# Patient Record
Sex: Female | Born: 1935 | Race: White | Hispanic: No
Health system: Southern US, Community
[De-identification: ages and names within clinical notes are randomized; demographics above are authoritative.]

---

## 2000-01-06 ENCOUNTER — Encounter: Payer: Self-pay | Admitting: Neurosurgery

## 2000-01-06 ENCOUNTER — Ambulatory Visit (HOSPITAL_COMMUNITY): Admission: RE | Admit: 2000-01-06 | Discharge: 2000-01-06 | Payer: Self-pay | Admitting: Neurosurgery

## 2004-07-01 ENCOUNTER — Ambulatory Visit: Payer: Self-pay | Admitting: Podiatry

## 2004-07-23 ENCOUNTER — Inpatient Hospital Stay: Payer: Self-pay | Admitting: Internal Medicine

## 2004-08-14 ENCOUNTER — Emergency Department: Payer: Self-pay | Admitting: Emergency Medicine

## 2004-12-13 ENCOUNTER — Ambulatory Visit: Payer: Self-pay | Admitting: Orthopedic Surgery

## 2005-02-26 ENCOUNTER — Other Ambulatory Visit: Payer: Self-pay

## 2005-02-26 ENCOUNTER — Inpatient Hospital Stay: Payer: Self-pay | Admitting: Internal Medicine

## 2005-03-07 ENCOUNTER — Other Ambulatory Visit: Payer: Self-pay

## 2005-03-07 ENCOUNTER — Inpatient Hospital Stay: Payer: Self-pay | Admitting: Internal Medicine

## 2005-04-03 ENCOUNTER — Ambulatory Visit: Payer: Self-pay | Admitting: Anesthesiology

## 2005-04-20 ENCOUNTER — Ambulatory Visit: Payer: Self-pay | Admitting: Anesthesiology

## 2005-05-10 ENCOUNTER — Ambulatory Visit: Payer: Self-pay | Admitting: Internal Medicine

## 2005-06-27 ENCOUNTER — Ambulatory Visit: Payer: Self-pay | Admitting: Anesthesiology

## 2005-08-15 ENCOUNTER — Ambulatory Visit: Payer: Self-pay | Admitting: Anesthesiology

## 2005-09-10 ENCOUNTER — Inpatient Hospital Stay: Payer: Self-pay | Admitting: Internal Medicine

## 2005-10-26 ENCOUNTER — Other Ambulatory Visit: Payer: Self-pay

## 2005-10-26 ENCOUNTER — Inpatient Hospital Stay: Payer: Self-pay | Admitting: Internal Medicine

## 2005-11-22 ENCOUNTER — Encounter: Payer: Self-pay | Admitting: Internal Medicine

## 2005-12-03 ENCOUNTER — Other Ambulatory Visit: Payer: Self-pay

## 2005-12-03 ENCOUNTER — Emergency Department: Payer: Self-pay | Admitting: Emergency Medicine

## 2005-12-07 ENCOUNTER — Ambulatory Visit: Payer: Self-pay | Admitting: Anesthesiology

## 2005-12-22 ENCOUNTER — Encounter: Payer: Self-pay | Admitting: Internal Medicine

## 2006-02-01 ENCOUNTER — Ambulatory Visit: Payer: Self-pay | Admitting: Anesthesiology

## 2006-03-02 ENCOUNTER — Encounter: Payer: Self-pay | Admitting: Orthopedic Surgery

## 2006-03-24 ENCOUNTER — Encounter: Payer: Self-pay | Admitting: Orthopedic Surgery

## 2006-03-31 ENCOUNTER — Other Ambulatory Visit: Payer: Self-pay

## 2006-03-31 ENCOUNTER — Emergency Department: Payer: Self-pay | Admitting: Emergency Medicine

## 2006-04-10 ENCOUNTER — Ambulatory Visit: Payer: Self-pay | Admitting: Internal Medicine

## 2006-04-13 ENCOUNTER — Ambulatory Visit: Payer: Self-pay | Admitting: Internal Medicine

## 2006-04-23 ENCOUNTER — Encounter: Payer: Self-pay | Admitting: Orthopedic Surgery

## 2006-04-30 ENCOUNTER — Ambulatory Visit: Payer: Self-pay | Admitting: Anesthesiology

## 2006-05-31 ENCOUNTER — Ambulatory Visit: Payer: Self-pay | Admitting: Surgery

## 2006-05-31 ENCOUNTER — Other Ambulatory Visit: Payer: Self-pay

## 2006-06-04 ENCOUNTER — Ambulatory Visit: Payer: Self-pay | Admitting: Surgery

## 2006-06-18 ENCOUNTER — Ambulatory Visit: Payer: Self-pay | Admitting: Anesthesiology

## 2006-07-02 ENCOUNTER — Ambulatory Visit: Payer: Self-pay | Admitting: Oncology

## 2006-07-25 ENCOUNTER — Ambulatory Visit: Payer: Self-pay | Admitting: Surgery

## 2006-07-30 ENCOUNTER — Ambulatory Visit: Payer: Self-pay | Admitting: Radiation Oncology

## 2006-08-02 ENCOUNTER — Ambulatory Visit: Payer: Self-pay | Admitting: Anesthesiology

## 2006-08-24 ENCOUNTER — Ambulatory Visit: Payer: Self-pay | Admitting: Radiation Oncology

## 2006-09-22 ENCOUNTER — Ambulatory Visit: Payer: Self-pay | Admitting: Radiation Oncology

## 2006-10-03 ENCOUNTER — Ambulatory Visit: Payer: Self-pay | Admitting: Anesthesiology

## 2006-10-23 ENCOUNTER — Ambulatory Visit: Payer: Self-pay | Admitting: Radiation Oncology

## 2006-11-06 ENCOUNTER — Ambulatory Visit: Payer: Self-pay | Admitting: Anesthesiology

## 2006-11-22 ENCOUNTER — Ambulatory Visit: Payer: Self-pay | Admitting: Radiation Oncology

## 2006-12-06 ENCOUNTER — Ambulatory Visit: Payer: Self-pay | Admitting: Anesthesiology

## 2007-01-07 ENCOUNTER — Ambulatory Visit: Payer: Self-pay | Admitting: Anesthesiology

## 2007-02-27 ENCOUNTER — Ambulatory Visit: Payer: Self-pay | Admitting: Anesthesiology

## 2007-03-25 ENCOUNTER — Ambulatory Visit: Payer: Self-pay | Admitting: Oncology

## 2007-04-07 ENCOUNTER — Inpatient Hospital Stay: Payer: Self-pay | Admitting: Internal Medicine

## 2007-04-07 ENCOUNTER — Other Ambulatory Visit: Payer: Self-pay

## 2007-04-18 ENCOUNTER — Ambulatory Visit: Payer: Self-pay | Admitting: Oncology

## 2007-04-22 ENCOUNTER — Ambulatory Visit: Payer: Self-pay | Admitting: Podiatry

## 2007-04-24 ENCOUNTER — Ambulatory Visit: Payer: Self-pay | Admitting: Oncology

## 2007-04-27 ENCOUNTER — Inpatient Hospital Stay: Payer: Self-pay | Admitting: Podiatry

## 2007-05-02 ENCOUNTER — Encounter: Payer: Self-pay | Admitting: Internal Medicine

## 2007-05-30 ENCOUNTER — Ambulatory Visit: Payer: Self-pay | Admitting: Internal Medicine

## 2007-06-05 ENCOUNTER — Emergency Department: Payer: Self-pay | Admitting: Unknown Physician Specialty

## 2007-06-13 ENCOUNTER — Encounter: Payer: Self-pay | Admitting: Internal Medicine

## 2007-06-24 ENCOUNTER — Encounter: Payer: Self-pay | Admitting: Internal Medicine

## 2007-06-27 ENCOUNTER — Ambulatory Visit: Payer: Self-pay | Admitting: Anesthesiology

## 2007-08-30 ENCOUNTER — Ambulatory Visit: Payer: Self-pay | Admitting: Anesthesiology

## 2007-10-14 ENCOUNTER — Ambulatory Visit: Payer: Self-pay | Admitting: Oncology

## 2007-10-15 ENCOUNTER — Ambulatory Visit: Payer: Self-pay | Admitting: Anesthesiology

## 2007-12-17 ENCOUNTER — Ambulatory Visit: Payer: Self-pay | Admitting: Anesthesiology

## 2008-01-28 ENCOUNTER — Ambulatory Visit: Payer: Self-pay | Admitting: Oncology

## 2008-02-22 ENCOUNTER — Ambulatory Visit: Payer: Self-pay | Admitting: Oncology

## 2008-03-09 ENCOUNTER — Ambulatory Visit: Payer: Self-pay

## 2008-03-18 ENCOUNTER — Ambulatory Visit: Payer: Self-pay | Admitting: Oncology

## 2008-03-24 ENCOUNTER — Ambulatory Visit: Payer: Self-pay | Admitting: Oncology

## 2008-04-03 ENCOUNTER — Ambulatory Visit: Payer: Self-pay | Admitting: Anesthesiology

## 2008-04-08 ENCOUNTER — Ambulatory Visit: Payer: Self-pay | Admitting: Unknown Physician Specialty

## 2008-06-02 ENCOUNTER — Ambulatory Visit: Payer: Self-pay | Admitting: Anesthesiology

## 2008-11-05 ENCOUNTER — Ambulatory Visit: Payer: Self-pay | Admitting: Anesthesiology

## 2008-12-22 ENCOUNTER — Ambulatory Visit: Payer: Self-pay | Admitting: Oncology

## 2008-12-24 ENCOUNTER — Ambulatory Visit: Payer: Self-pay | Admitting: Oncology

## 2008-12-25 ENCOUNTER — Ambulatory Visit: Payer: Self-pay | Admitting: Oncology

## 2009-01-04 ENCOUNTER — Ambulatory Visit: Payer: Self-pay | Admitting: Anesthesiology

## 2009-01-21 ENCOUNTER — Ambulatory Visit: Payer: Self-pay | Admitting: Oncology

## 2009-03-21 ENCOUNTER — Ambulatory Visit: Payer: Self-pay | Admitting: Internal Medicine

## 2009-04-26 ENCOUNTER — Ambulatory Visit: Payer: Self-pay | Admitting: Anesthesiology

## 2009-06-23 ENCOUNTER — Ambulatory Visit: Payer: Self-pay | Admitting: Anesthesiology

## 2009-07-09 ENCOUNTER — Emergency Department: Payer: Self-pay | Admitting: Emergency Medicine

## 2009-07-24 ENCOUNTER — Ambulatory Visit: Payer: Self-pay | Admitting: Oncology

## 2009-08-16 ENCOUNTER — Ambulatory Visit: Payer: Self-pay | Admitting: Oncology

## 2009-08-24 ENCOUNTER — Ambulatory Visit: Payer: Self-pay | Admitting: Oncology

## 2009-09-10 ENCOUNTER — Ambulatory Visit: Payer: Self-pay | Admitting: Anesthesiology

## 2009-10-28 ENCOUNTER — Ambulatory Visit: Payer: Self-pay | Admitting: Unknown Physician Specialty

## 2009-11-30 ENCOUNTER — Ambulatory Visit: Payer: Self-pay | Admitting: Anesthesiology

## 2009-12-03 ENCOUNTER — Emergency Department: Payer: Self-pay | Admitting: Emergency Medicine

## 2009-12-22 ENCOUNTER — Ambulatory Visit: Payer: Self-pay | Admitting: Oncology

## 2009-12-27 ENCOUNTER — Ambulatory Visit: Payer: Self-pay | Admitting: Oncology

## 2010-01-13 ENCOUNTER — Ambulatory Visit: Payer: Self-pay | Admitting: Oncology

## 2010-01-14 ENCOUNTER — Ambulatory Visit: Payer: Self-pay | Admitting: Internal Medicine

## 2010-01-21 ENCOUNTER — Ambulatory Visit: Payer: Self-pay | Admitting: Oncology

## 2010-01-28 ENCOUNTER — Ambulatory Visit: Payer: Self-pay | Admitting: Anesthesiology

## 2010-05-13 ENCOUNTER — Ambulatory Visit: Payer: Self-pay | Admitting: Anesthesiology

## 2010-07-25 ENCOUNTER — Emergency Department: Payer: Self-pay | Admitting: Emergency Medicine

## 2010-07-30 ENCOUNTER — Emergency Department: Payer: Self-pay | Admitting: Emergency Medicine

## 2010-08-29 ENCOUNTER — Ambulatory Visit: Payer: Self-pay | Admitting: Anesthesiology

## 2010-11-30 ENCOUNTER — Ambulatory Visit: Payer: Self-pay | Admitting: Anesthesiology

## 2010-12-29 ENCOUNTER — Ambulatory Visit: Payer: Self-pay | Admitting: Podiatry

## 2011-01-20 ENCOUNTER — Ambulatory Visit: Payer: Self-pay | Admitting: Oncology

## 2011-02-15 ENCOUNTER — Ambulatory Visit: Payer: Self-pay | Admitting: Oncology

## 2011-02-28 ENCOUNTER — Ambulatory Visit: Payer: Self-pay | Admitting: Anesthesiology

## 2011-03-09 ENCOUNTER — Ambulatory Visit: Payer: Self-pay | Admitting: Oncology

## 2011-03-25 ENCOUNTER — Ambulatory Visit: Payer: Self-pay | Admitting: Oncology

## 2011-05-17 ENCOUNTER — Ambulatory Visit: Payer: Self-pay | Admitting: Anesthesiology

## 2011-07-01 ENCOUNTER — Emergency Department: Payer: Self-pay | Admitting: *Deleted

## 2011-09-06 ENCOUNTER — Ambulatory Visit: Payer: Self-pay | Admitting: Anesthesiology

## 2011-11-21 ENCOUNTER — Emergency Department: Payer: Self-pay | Admitting: Emergency Medicine

## 2011-11-21 LAB — CBC
HCT: 39.4 % (ref 35.0–47.0)
HGB: 13 g/dL (ref 12.0–16.0)
MCH: 29.9 pg (ref 26.0–34.0)
MCHC: 32.9 g/dL (ref 32.0–36.0)
MCV: 91 fL (ref 80–100)
Platelet: 260 10*3/uL (ref 150–440)
RBC: 4.34 10*6/uL (ref 3.80–5.20)
RDW: 14.8 % — ABNORMAL HIGH (ref 11.5–14.5)
WBC: 9.6 10*3/uL (ref 3.6–11.0)

## 2011-11-21 LAB — COMPREHENSIVE METABOLIC PANEL
Albumin: 3.2 g/dL — ABNORMAL LOW (ref 3.4–5.0)
Alkaline Phosphatase: 77 U/L (ref 50–136)
Anion Gap: 6 — ABNORMAL LOW (ref 7–16)
BUN: 21 mg/dL — ABNORMAL HIGH (ref 7–18)
Bilirubin,Total: 0.4 mg/dL (ref 0.2–1.0)
Calcium, Total: 9.1 mg/dL (ref 8.5–10.1)
Chloride: 106 mmol/L (ref 98–107)
Co2: 28 mmol/L (ref 21–32)
Creatinine: 0.57 mg/dL — ABNORMAL LOW (ref 0.60–1.30)
EGFR (African American): >60
EGFR (Non-African Amer.): >60
Glucose: 97 mg/dL (ref 65–99)
Osmolality: 282 (ref 275–301)
Potassium: 4.1 mmol/L (ref 3.5–5.1)
SGOT(AST): 19 U/L (ref 15–37)
SGPT (ALT): 15 U/L
Sodium: 140 mmol/L (ref 136–145)
Total Protein: 6.5 g/dL (ref 6.4–8.2)

## 2011-11-21 LAB — URINALYSIS, COMPLETE
Bacteria: NONE SEEN
Bilirubin,UR: NEGATIVE
Blood: NEGATIVE
Glucose,UR: NEGATIVE mg/dL (ref 0–75)
Ketone: NEGATIVE
Nitrite: NEGATIVE
Ph: 6 (ref 4.5–8.0)
Protein: NEGATIVE
RBC,UR: 4 /HPF (ref 0–5)
Specific Gravity: 1.042 (ref 1.003–1.030)
Squamous Epithelial: 1
WBC UR: 5 /HPF (ref 0–5)

## 2011-11-21 LAB — TROPONIN I: Troponin-I: <0.02 ng/mL

## 2011-11-27 ENCOUNTER — Emergency Department: Payer: Self-pay | Admitting: Emergency Medicine

## 2011-11-27 LAB — COMPREHENSIVE METABOLIC PANEL
Albumin: 3.2 g/dL — ABNORMAL LOW (ref 3.4–5.0)
Alkaline Phosphatase: 72 U/L (ref 50–136)
Anion Gap: 3 — ABNORMAL LOW (ref 7–16)
BUN: 19 mg/dL — ABNORMAL HIGH (ref 7–18)
Bilirubin,Total: 0.2 mg/dL (ref 0.2–1.0)
Calcium, Total: 9.6 mg/dL (ref 8.5–10.1)
Chloride: 105 mmol/L (ref 98–107)
Co2: 31 mmol/L (ref 21–32)
Creatinine: 0.66 mg/dL (ref 0.60–1.30)
EGFR (African American): >60
EGFR (Non-African Amer.): >60
Glucose: 103 mg/dL — ABNORMAL HIGH (ref 65–99)
Osmolality: 280 (ref 275–301)
Potassium: 4.7 mmol/L (ref 3.5–5.1)
SGOT(AST): 42 U/L — ABNORMAL HIGH (ref 15–37)
SGPT (ALT): 25 U/L
Sodium: 139 mmol/L (ref 136–145)
Total Protein: 6.5 g/dL (ref 6.4–8.2)

## 2011-11-27 LAB — CBC
HCT: 39.3 % (ref 35.0–47.0)
HGB: 12.6 g/dL (ref 12.0–16.0)
MCH: 29.5 pg (ref 26.0–34.0)
MCHC: 32 g/dL (ref 32.0–36.0)
MCV: 92 fL (ref 80–100)
Platelet: 229 10*3/uL (ref 150–440)
RBC: 4.27 10*6/uL (ref 3.80–5.20)
RDW: 15.1 % — ABNORMAL HIGH (ref 11.5–14.5)
WBC: 6.6 10*3/uL (ref 3.6–11.0)

## 2011-11-27 LAB — LIPASE, BLOOD: Lipase: 47 U/L — ABNORMAL LOW (ref 73–393)

## 2011-11-27 LAB — URINALYSIS, COMPLETE
Bilirubin,UR: NEGATIVE
Blood: NEGATIVE
Glucose,UR: NEGATIVE mg/dL (ref 0–75)
Ketone: NEGATIVE
Nitrite: NEGATIVE
Ph: 5 (ref 4.5–8.0)
Protein: 30
RBC,UR: 26 /HPF (ref 0–5)
Renal Epithelial: <1
Specific Gravity: 1.024 (ref 1.003–1.030)
Squamous Epithelial: 5
Transitional Epi: 4
WBC UR: 176 /HPF (ref 0–5)

## 2011-11-30 LAB — CBC WITH DIFFERENTIAL/PLATELET
Basophil #: 0 10*3/uL (ref 0.0–0.1)
Basophil %: 0.3 %
Eosinophil %: 1.5 %
HCT: 35.5 % (ref 35.0–47.0)
HGB: 11.9 g/dL — ABNORMAL LOW (ref 12.0–16.0)
Lymphocyte #: 0.9 10*3/uL — ABNORMAL LOW (ref 1.0–3.6)
Lymphocyte %: 14.3 %
MCH: 30.4 pg (ref 26.0–34.0)
MCHC: 33.5 g/dL (ref 32.0–36.0)
Monocyte #: 0.6 x10 3/mm (ref 0.2–0.9)
Neutrophil %: 73.4 %
Platelet: 218 10*3/uL (ref 150–440)
RDW: 14.7 % — ABNORMAL HIGH (ref 11.5–14.5)
WBC: 6.1 10*3/uL (ref 3.6–11.0)

## 2011-11-30 LAB — COMPREHENSIVE METABOLIC PANEL
Albumin: 3.1 g/dL — ABNORMAL LOW (ref 3.4–5.0)
Alkaline Phosphatase: 61 U/L (ref 50–136)
Anion Gap: 8 (ref 7–16)
BUN: 15 mg/dL (ref 7–18)
Bilirubin,Total: 0.3 mg/dL (ref 0.2–1.0)
Calcium, Total: 9 mg/dL (ref 8.5–10.1)
Chloride: 103 mmol/L (ref 98–107)
Co2: 28 mmol/L (ref 21–32)
Creatinine: 0.64 mg/dL (ref 0.60–1.30)
EGFR (African American): >60
EGFR (Non-African Amer.): >60
Glucose: 121 mg/dL — ABNORMAL HIGH (ref 65–99)
Osmolality: 280 (ref 275–301)
Potassium: 3.4 mmol/L — ABNORMAL LOW (ref 3.5–5.1)
SGOT(AST): 40 U/L — ABNORMAL HIGH (ref 15–37)
SGPT (ALT): 28 U/L
Sodium: 139 mmol/L (ref 136–145)
Total Protein: 6.2 g/dL — ABNORMAL LOW (ref 6.4–8.2)

## 2011-11-30 LAB — DRUG SCREEN, URINE
Barbiturates, Ur Screen: NEGATIVE (ref ?–200)
Cannabinoid 50 Ng, Ur ~~LOC~~: NEGATIVE (ref ?–50)
Cocaine Metabolite,Ur ~~LOC~~: NEGATIVE (ref ?–300)
MDMA (Ecstasy)Ur Screen: NEGATIVE (ref ?–500)
Opiate, Ur Screen: POSITIVE (ref ?–300)
Phencyclidine (PCP) Ur S: NEGATIVE (ref ?–25)
Tricyclic, Ur Screen: POSITIVE (ref ?–1000)

## 2011-11-30 LAB — URINALYSIS, COMPLETE
Bilirubin,UR: NEGATIVE
Glucose,UR: NEGATIVE mg/dL (ref 0–75)
Nitrite: NEGATIVE
Ph: 6 (ref 4.5–8.0)
Protein: NEGATIVE
RBC,UR: 1 /HPF (ref 0–5)
Specific Gravity: 1.016 (ref 1.003–1.030)
Squamous Epithelial: <1
WBC UR: 4 /HPF (ref 0–5)

## 2011-11-30 LAB — THEOPHYLLINE LEVEL: Theophylline: 11.4 ug/mL (ref 10.0–20.0)

## 2011-11-30 LAB — AMMONIA: Ammonia, Plasma: <25 mcmol/L (ref 11–32)

## 2011-12-01 LAB — BASIC METABOLIC PANEL
Anion Gap: 10 (ref 7–16)
BUN: 10 mg/dL (ref 7–18)
Chloride: 106 mmol/L (ref 98–107)
Glucose: 77 mg/dL (ref 65–99)
Osmolality: 277 (ref 275–301)
Potassium: 3.3 mmol/L — ABNORMAL LOW (ref 3.5–5.1)
Sodium: 140 mmol/L (ref 136–145)

## 2011-12-01 LAB — CBC WITH DIFFERENTIAL/PLATELET
Basophil %: 0.4 %
Eosinophil %: 4.7 %
HCT: 34.5 % — ABNORMAL LOW (ref 35.0–47.0)
Lymphocyte #: 1.6 10*3/uL (ref 1.0–3.6)
MCH: 30.2 pg (ref 26.0–34.0)
MCV: 90 fL (ref 80–100)
Monocyte #: 0.8 x10 3/mm (ref 0.2–0.9)
Neutrophil %: 54.4 %
Platelet: 210 10*3/uL (ref 150–440)
RDW: 15 % — ABNORMAL HIGH (ref 11.5–14.5)
WBC: 5.9 10*3/uL (ref 3.6–11.0)

## 2011-12-01 LAB — TSH: Thyroid Stimulating Horm: 2.47 u[IU]/mL

## 2011-12-01 LAB — HEPATIC FUNCTION PANEL A (ARMC)
Bilirubin, Direct: <0.1 mg/dL (ref 0.00–0.20)
SGPT (ALT): 26 U/L

## 2011-12-02 ENCOUNTER — Ambulatory Visit: Payer: Self-pay | Admitting: Neurology

## 2011-12-02 ENCOUNTER — Inpatient Hospital Stay: Payer: Self-pay | Admitting: Internal Medicine

## 2011-12-02 LAB — BASIC METABOLIC PANEL
Anion Gap: 10 (ref 7–16)
BUN: 8 mg/dL (ref 7–18)
Calcium, Total: 8.3 mg/dL — ABNORMAL LOW (ref 8.5–10.1)
Chloride: 107 mmol/L (ref 98–107)
Co2: 25 mmol/L (ref 21–32)
EGFR (Non-African Amer.): >60
Glucose: 74 mg/dL (ref 65–99)
Potassium: 3.3 mmol/L — ABNORMAL LOW (ref 3.5–5.1)

## 2011-12-02 LAB — CBC WITH DIFFERENTIAL/PLATELET
Basophil #: 0 10*3/uL (ref 0.0–0.1)
Eosinophil #: 0.3 10*3/uL (ref 0.0–0.7)
Eosinophil %: 4.8 %
Lymphocyte %: 28.2 %
MCH: 29.9 pg (ref 26.0–34.0)
MCV: 92 fL (ref 80–100)
Monocyte #: 0.8 x10 3/mm (ref 0.2–0.9)
Monocyte %: 12.2 %
WBC: 6.6 10*3/uL (ref 3.6–11.0)

## 2011-12-03 LAB — BASIC METABOLIC PANEL
Anion Gap: 10 (ref 7–16)
BUN: 12 mg/dL (ref 7–18)
Co2: 26 mmol/L (ref 21–32)
Creatinine: 0.59 mg/dL — ABNORMAL LOW (ref 0.60–1.30)
EGFR (African American): >60
EGFR (Non-African Amer.): >60
Glucose: 75 mg/dL (ref 65–99)
Osmolality: 283 (ref 275–301)
Sodium: 143 mmol/L (ref 136–145)

## 2011-12-03 LAB — URINE CULTURE

## 2011-12-05 LAB — BASIC METABOLIC PANEL
Calcium, Total: 8.8 mg/dL (ref 8.5–10.1)
Creatinine: 0.6 mg/dL (ref 0.60–1.30)
EGFR (African American): >60
EGFR (Non-African Amer.): >60
Glucose: 95 mg/dL (ref 65–99)
Osmolality: 280 (ref 275–301)
Potassium: 3.6 mmol/L (ref 3.5–5.1)

## 2011-12-05 LAB — CBC WITH DIFFERENTIAL/PLATELET
Basophil #: 0 10*3/uL (ref 0.0–0.1)
Basophil %: 0.5 %
Eosinophil #: 0.3 10*3/uL (ref 0.0–0.7)
Eosinophil %: 5 %
HCT: 39.5 % (ref 35.0–47.0)
HGB: 12.7 g/dL (ref 12.0–16.0)
Lymphocyte %: 27.2 %
MCH: 29.6 pg (ref 26.0–34.0)
MCHC: 32.1 g/dL (ref 32.0–36.0)
Monocyte #: 0.7 x10 3/mm (ref 0.2–0.9)
Neutrophil #: 3.9 10*3/uL (ref 1.4–6.5)
Neutrophil %: 57 %
Platelet: 172 10*3/uL (ref 150–440)
RBC: 4.29 10*6/uL (ref 3.80–5.20)
RDW: 15.5 % — ABNORMAL HIGH (ref 11.5–14.5)
WBC: 6.9 10*3/uL (ref 3.6–11.0)

## 2011-12-07 ENCOUNTER — Inpatient Hospital Stay: Payer: Self-pay | Admitting: Psychiatry

## 2011-12-07 LAB — CBC WITH DIFFERENTIAL/PLATELET
Basophil #: 0 10*3/uL (ref 0.0–0.1)
Eosinophil #: 0.2 10*3/uL (ref 0.0–0.7)
Eosinophil %: 4.5 %
HCT: 36.5 % (ref 35.0–47.0)
HGB: 12 g/dL (ref 12.0–16.0)
Lymphocyte %: 37.8 %
MCH: 30.1 pg (ref 26.0–34.0)
MCHC: 32.8 g/dL (ref 32.0–36.0)
MCV: 92 fL (ref 80–100)
Neutrophil #: 2.1 10*3/uL (ref 1.4–6.5)
RDW: 15.7 % — ABNORMAL HIGH (ref 11.5–14.5)

## 2011-12-07 LAB — COMPREHENSIVE METABOLIC PANEL
BUN: 20 mg/dL — ABNORMAL HIGH (ref 7–18)
Bilirubin,Total: 0.4 mg/dL (ref 0.2–1.0)
Calcium, Total: 8.8 mg/dL (ref 8.5–10.1)
Chloride: 109 mmol/L — ABNORMAL HIGH (ref 98–107)
Co2: 27 mmol/L (ref 21–32)
Creatinine: 0.56 mg/dL — ABNORMAL LOW (ref 0.60–1.30)
EGFR (African American): >60
Glucose: 71 mg/dL (ref 65–99)
SGPT (ALT): 24 U/L
Sodium: 144 mmol/L (ref 136–145)

## 2011-12-07 LAB — AMMONIA: Ammonia, Plasma: <25 mcmol/L (ref 11–32)

## 2011-12-07 LAB — VALPROIC ACID LEVEL: Valproic Acid: 71 ug/mL

## 2011-12-07 LAB — HEPATIC FUNCTION PANEL A (ARMC): Bilirubin, Direct: 0.1 mg/dL (ref 0.00–0.20)

## 2011-12-11 LAB — PLATELET COUNT: Platelet: 214 10*3/uL (ref 150–440)

## 2011-12-12 LAB — HEMOGLOBIN: HGB: 12 g/dL (ref 12.0–16.0)

## 2011-12-13 LAB — VALPROIC ACID LEVEL: Valproic Acid: 80 ug/mL

## 2011-12-15 LAB — COMPREHENSIVE METABOLIC PANEL
Albumin: 3.3 g/dL — ABNORMAL LOW (ref 3.4–5.0)
Albumin: 3 g/dL — ABNORMAL LOW (ref 3.4–5.0)
Alkaline Phosphatase: 65 U/L (ref 50–136)
Anion Gap: 10 (ref 7–16)
Anion Gap: 12 (ref 7–16)
BUN: 33 mg/dL — ABNORMAL HIGH (ref 7–18)
BUN: 43 mg/dL — ABNORMAL HIGH (ref 7–18)
Bilirubin,Total: 0.5 mg/dL (ref 0.2–1.0)
Bilirubin,Total: 0.3 mg/dL (ref 0.2–1.0)
Calcium, Total: 9.6 mg/dL (ref 8.5–10.1)
Chloride: 101 mmol/L (ref 98–107)
Chloride: 104 mmol/L (ref 98–107)
Co2: 31 mmol/L (ref 21–32)
Co2: 26 mmol/L (ref 21–32)
Creatinine: 0.96 mg/dL (ref 0.60–1.30)
EGFR (African American): >60
EGFR (African American): >60
EGFR (Non-African Amer.): >60
EGFR (Non-African Amer.): 58 — ABNORMAL LOW
Glucose: 198 mg/dL — ABNORMAL HIGH (ref 65–99)
Osmolality: 295 (ref 275–301)
Osmolality: 299 (ref 275–301)
Potassium: 4.9 mmol/L (ref 3.5–5.1)
Potassium: 4.6 mmol/L (ref 3.5–5.1)
SGOT(AST): 24 U/L (ref 15–37)
SGOT(AST): 25 U/L (ref 15–37)
SGPT (ALT): 21 U/L
Sodium: 142 mmol/L (ref 136–145)
Sodium: 142 mmol/L (ref 136–145)

## 2011-12-15 LAB — CBC
HCT: 44.6 % (ref 35.0–47.0)
HGB: 14.3 g/dL (ref 12.0–16.0)
MCH: 29.9 pg (ref 26.0–34.0)
MCHC: 32.1 g/dL (ref 32.0–36.0)
Platelet: 210 10*3/uL (ref 150–440)
RBC: 4.79 10*6/uL (ref 3.80–5.20)
RDW: 15.8 % — ABNORMAL HIGH (ref 11.5–14.5)
WBC: 16 10*3/uL — ABNORMAL HIGH (ref 3.6–11.0)

## 2011-12-15 LAB — URINALYSIS, COMPLETE
Glucose,UR: NEGATIVE mg/dL (ref 0–75)
Nitrite: POSITIVE
RBC,UR: 7 /HPF (ref 0–5)
Specific Gravity: 1.018 (ref 1.003–1.030)

## 2011-12-15 LAB — MAGNESIUM: Magnesium: 3 mg/dL — ABNORMAL HIGH

## 2011-12-15 LAB — PLATELET COUNT: Platelet: 204 10*3/uL (ref 150–440)

## 2011-12-15 LAB — VALPROIC ACID LEVEL: Valproic Acid: 42 ug/mL — ABNORMAL LOW

## 2011-12-15 LAB — HEMOGLOBIN: HGB: 14.9 g/dL (ref 12.0–16.0)

## 2011-12-15 LAB — TROPONIN I: Troponin-I: <0.02 ng/mL

## 2011-12-16 LAB — CBC WITH DIFFERENTIAL/PLATELET
Bands: 13 %
Comment - H1-Com1: NORMAL
Comment - H1-Com2: NORMAL
HGB: 13.4 g/dL (ref 12.0–16.0)
Lymphocytes: 4 %
MCV: 94 fL (ref 80–100)
Monocytes: 18 %
RBC: 4.55 10*6/uL (ref 3.80–5.20)
Segmented Neutrophils: 65 %

## 2011-12-16 LAB — BASIC METABOLIC PANEL
Anion Gap: 10 (ref 7–16)
Chloride: 105 mmol/L (ref 98–107)
Co2: 27 mmol/L (ref 21–32)
Creatinine: 0.58 mg/dL — ABNORMAL LOW (ref 0.60–1.30)
EGFR (Non-African Amer.): >60
Glucose: 89 mg/dL (ref 65–99)
Osmolality: 294 (ref 275–301)

## 2011-12-17 LAB — CBC WITH DIFFERENTIAL/PLATELET
Basophil #: 0.1 10*3/uL (ref 0.0–0.1)
Basophil %: 0.6 %
Eosinophil %: 0.4 %
HCT: 34.3 % — ABNORMAL LOW (ref 35.0–47.0)
HGB: 11.2 g/dL — ABNORMAL LOW (ref 12.0–16.0)
Lymphocyte #: 1.3 10*3/uL (ref 1.0–3.6)
Lymphocytes: 7 %
MCHC: 32.6 g/dL (ref 32.0–36.0)
MCV: 93 fL (ref 80–100)
Monocyte #: 2.2 x10 3/mm — ABNORMAL HIGH (ref 0.2–0.9)
Monocyte %: 15.6 %
Neutrophil #: 10.3 10*3/uL — ABNORMAL HIGH (ref 1.4–6.5)
Neutrophil %: 73.9 %
Platelet: 145 10*3/uL — ABNORMAL LOW (ref 150–440)
RBC: 3.71 10*6/uL — ABNORMAL LOW (ref 3.80–5.20)
RDW: 16.1 % — ABNORMAL HIGH (ref 11.5–14.5)

## 2011-12-17 LAB — BASIC METABOLIC PANEL
Anion Gap: 8 (ref 7–16)
BUN: 25 mg/dL — ABNORMAL HIGH (ref 7–18)
Chloride: 110 mmol/L — ABNORMAL HIGH (ref 98–107)
Co2: 25 mmol/L (ref 21–32)
Creatinine: 0.47 mg/dL — ABNORMAL LOW (ref 0.60–1.30)
EGFR (Non-African Amer.): >60
Glucose: 75 mg/dL (ref 65–99)
Osmolality: 288 (ref 275–301)
Potassium: 3.9 mmol/L (ref 3.5–5.1)
Sodium: 143 mmol/L (ref 136–145)

## 2011-12-18 LAB — COMPREHENSIVE METABOLIC PANEL
Alkaline Phosphatase: 55 U/L (ref 50–136)
Bilirubin,Total: 0.3 mg/dL (ref 0.2–1.0)
Calcium, Total: 7.7 mg/dL — ABNORMAL LOW (ref 8.5–10.1)
Co2: 28 mmol/L (ref 21–32)
EGFR (African American): >60
Osmolality: 281 (ref 275–301)
Potassium: 3.4 mmol/L — ABNORMAL LOW (ref 3.5–5.1)
SGOT(AST): 15 U/L (ref 15–37)
SGPT (ALT): 10 U/L — ABNORMAL LOW
Sodium: 142 mmol/L (ref 136–145)
Total Protein: 5 g/dL — ABNORMAL LOW (ref 6.4–8.2)

## 2011-12-18 LAB — CBC WITH DIFFERENTIAL/PLATELET
Basophil #: 0 10*3/uL (ref 0.0–0.1)
Basophil %: 0.3 %
Eosinophil #: 0.1 10*3/uL (ref 0.0–0.7)
Eosinophil %: 0.8 %
HGB: 11.7 g/dL — ABNORMAL LOW (ref 12.0–16.0)
Lymphocyte #: 1.6 10*3/uL (ref 1.0–3.6)
MCH: 29.7 pg (ref 26.0–34.0)
MCHC: 32.3 g/dL (ref 32.0–36.0)
MCV: 92 fL (ref 80–100)
Monocyte %: 11.9 %
Neutrophil #: 6.5 10*3/uL (ref 1.4–6.5)
Neutrophil %: 70.2 %
Platelet: 160 10*3/uL (ref 150–440)
RBC: 3.93 10*6/uL (ref 3.80–5.20)
WBC: 9.3 10*3/uL (ref 3.6–11.0)

## 2011-12-19 ENCOUNTER — Inpatient Hospital Stay: Payer: Self-pay | Admitting: Internal Medicine

## 2011-12-19 LAB — BASIC METABOLIC PANEL
Anion Gap: 8 (ref 7–16)
Chloride: 101 mmol/L (ref 98–107)
Co2: 29 mmol/L (ref 21–32)
Creatinine: 0.38 mg/dL — ABNORMAL LOW (ref 0.60–1.30)
EGFR (African American): >60
EGFR (Non-African Amer.): >60
Glucose: 62 mg/dL — ABNORMAL LOW (ref 65–99)
Osmolality: 271 (ref 275–301)
Potassium: 3.7 mmol/L (ref 3.5–5.1)

## 2011-12-19 LAB — CBC WITH DIFFERENTIAL/PLATELET
Basophil #: 0 10*3/uL (ref 0.0–0.1)
Basophil %: 0.3 %
Eosinophil %: 1.1 %
HCT: 35.9 % (ref 35.0–47.0)
Lymphocyte #: 1.5 10*3/uL (ref 1.0–3.6)
MCHC: 32.8 g/dL (ref 32.0–36.0)
Monocyte #: 1.4 x10 3/mm — ABNORMAL HIGH (ref 0.2–0.9)
Monocyte %: 13.1 %
Neutrophil %: 71.1 %
Platelet: 183 10*3/uL (ref 150–440)

## 2011-12-20 LAB — CULTURE, BLOOD (SINGLE)

## 2011-12-22 LAB — CBC WITH DIFFERENTIAL/PLATELET
Basophil #: 0 10*3/uL (ref 0.0–0.1)
Basophil %: 0.2 %
Eosinophil #: 0 10*3/uL (ref 0.0–0.7)
Eosinophil %: 0.3 %
HCT: 35 % (ref 35.0–47.0)
HGB: 11.5 g/dL — ABNORMAL LOW (ref 12.0–16.0)
Lymphocyte %: 9.6 %
MCH: 29.6 pg (ref 26.0–34.0)
Neutrophil #: 11.1 10*3/uL — ABNORMAL HIGH (ref 1.4–6.5)
Neutrophil %: 74.7 %
RBC: 3.9 10*6/uL (ref 3.80–5.20)
RDW: 15.2 % — ABNORMAL HIGH (ref 11.5–14.5)
WBC: 14.9 10*3/uL — ABNORMAL HIGH (ref 3.6–11.0)

## 2011-12-22 LAB — BASIC METABOLIC PANEL
Anion Gap: 9 (ref 7–16)
Calcium, Total: 8.7 mg/dL (ref 8.5–10.1)
Chloride: 95 mmol/L — ABNORMAL LOW (ref 98–107)
Co2: 29 mmol/L (ref 21–32)
EGFR (African American): >60
EGFR (Non-African Amer.): >60
Glucose: 94 mg/dL (ref 65–99)
Sodium: 133 mmol/L — ABNORMAL LOW (ref 136–145)

## 2011-12-23 LAB — CBC WITH DIFFERENTIAL/PLATELET
Basophil %: 0.4 %
Eosinophil #: 0 10*3/uL (ref 0.0–0.7)
Eosinophil %: 0.3 %
HCT: 34.7 % — ABNORMAL LOW (ref 35.0–47.0)
HGB: 11.2 g/dL — ABNORMAL LOW (ref 12.0–16.0)
Lymphocyte #: 1.2 10*3/uL (ref 1.0–3.6)
Lymphocyte %: 8 %
MCHC: 32.4 g/dL (ref 32.0–36.0)
MCV: 91 fL (ref 80–100)
Monocyte %: 9.3 %
Neutrophil #: 12.4 10*3/uL — ABNORMAL HIGH (ref 1.4–6.5)
Neutrophil %: 82 %
WBC: 15.1 10*3/uL — ABNORMAL HIGH (ref 3.6–11.0)

## 2011-12-23 LAB — BASIC METABOLIC PANEL
BUN: 16 mg/dL (ref 7–18)
Creatinine: 0.38 mg/dL — ABNORMAL LOW (ref 0.60–1.30)
EGFR (Non-African Amer.): >60
Glucose: 88 mg/dL (ref 65–99)
Osmolality: 267 (ref 275–301)
Potassium: 3.7 mmol/L (ref 3.5–5.1)

## 2011-12-24 LAB — CBC WITH DIFFERENTIAL/PLATELET
Basophil #: 0 10*3/uL (ref 0.0–0.1)
Basophil %: 0.3 %
Eosinophil %: 0.8 %
HCT: 32.5 % — ABNORMAL LOW (ref 35.0–47.0)
HGB: 10.9 g/dL — ABNORMAL LOW (ref 12.0–16.0)
Lymphocyte %: 12.8 %
MCH: 30.4 pg (ref 26.0–34.0)
MCHC: 33.6 g/dL (ref 32.0–36.0)
MCV: 91 fL (ref 80–100)
Monocyte #: 0.7 x10 3/mm (ref 0.2–0.9)
Monocyte %: 6.4 %
Neutrophil %: 79.7 %
WBC: 10.5 10*3/uL (ref 3.6–11.0)

## 2011-12-24 LAB — COMPREHENSIVE METABOLIC PANEL
BUN: 19 mg/dL — ABNORMAL HIGH (ref 7–18)
Chloride: 97 mmol/L — ABNORMAL LOW (ref 98–107)
Co2: 28 mmol/L (ref 21–32)
EGFR (African American): >60
Glucose: 84 mg/dL (ref 65–99)
Osmolality: 268 (ref 275–301)
Potassium: 3.7 mmol/L (ref 3.5–5.1)
SGPT (ALT): 15 U/L
Total Protein: 5.5 g/dL — ABNORMAL LOW (ref 6.4–8.2)

## 2011-12-25 LAB — BASIC METABOLIC PANEL
Anion Gap: 8 (ref 7–16)
Chloride: 100 mmol/L (ref 98–107)
Creatinine: 0.29 mg/dL — ABNORMAL LOW (ref 0.60–1.30)
EGFR (Non-African Amer.): >60
Osmolality: 275 (ref 275–301)
Potassium: 4 mmol/L (ref 3.5–5.1)
Sodium: 138 mmol/L (ref 136–145)

## 2011-12-25 LAB — CBC WITH DIFFERENTIAL/PLATELET
Basophil #: 0 10*3/uL (ref 0.0–0.1)
HGB: 10.5 g/dL — ABNORMAL LOW (ref 12.0–16.0)
Lymphocyte #: 1.9 10*3/uL (ref 1.0–3.6)
MCH: 29.5 pg (ref 26.0–34.0)
MCHC: 32.7 g/dL (ref 32.0–36.0)
MCV: 90 fL (ref 80–100)
Monocyte #: 0.6 x10 3/mm (ref 0.2–0.9)
Monocyte %: 6 %
Neutrophil %: 71.9 %
Platelet: 339 10*3/uL (ref 150–440)
RBC: 3.55 10*6/uL — ABNORMAL LOW (ref 3.80–5.20)
RDW: 15.1 % — ABNORMAL HIGH (ref 11.5–14.5)
WBC: 9.7 10*3/uL (ref 3.6–11.0)

## 2011-12-27 LAB — IRON AND TIBC
Iron Bind.Cap.(Total): 197 ug/dL — ABNORMAL LOW (ref 250–450)
Iron Saturation: 22 %
Unbound Iron-Bind.Cap.: 153 ug/dL

## 2011-12-27 LAB — CULTURE, BLOOD (SINGLE)

## 2013-09-04 IMAGING — CT CT ABD-PELV W/ CM
1 of 3 series · 13 of 32 positions shown, 18 images · non-contrast
Comparison: none

REASON FOR EXAM: (1) pain  diarrhea; (2) same
COMMENTS:

[Series 2: 3mm soft tissue · axial · 0.68mm/px · z∈[-229,+137]mm · 13 of 140 slices shown, 18 images]
[im 9/140  soft-tissue]
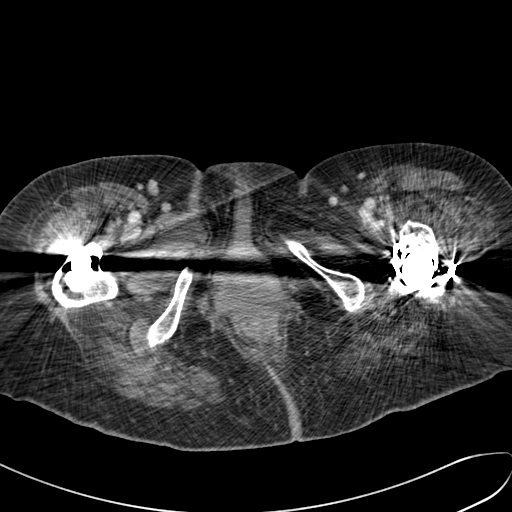
[im 9/140  bone]
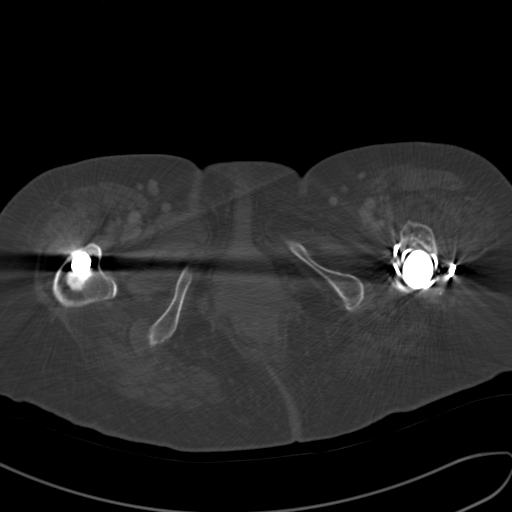
[im 18/140  soft-tissue]
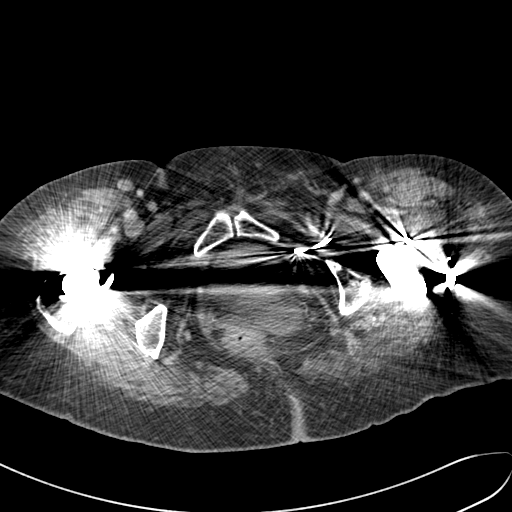
[im 35/140  soft-tissue]
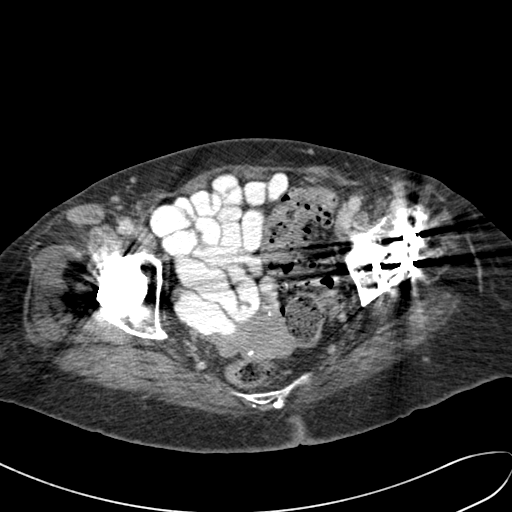
[im 44/140  soft-tissue]
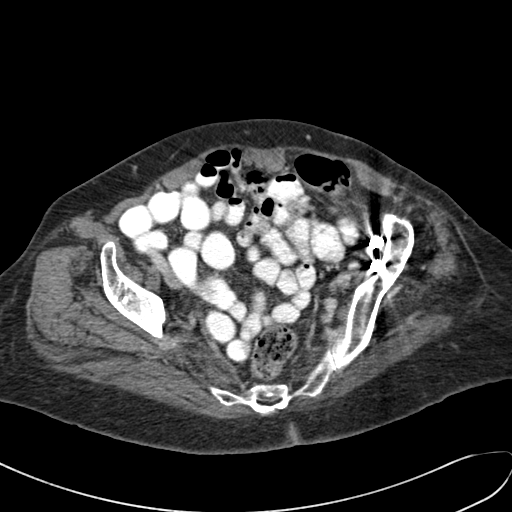
[im 53/140  soft-tissue]
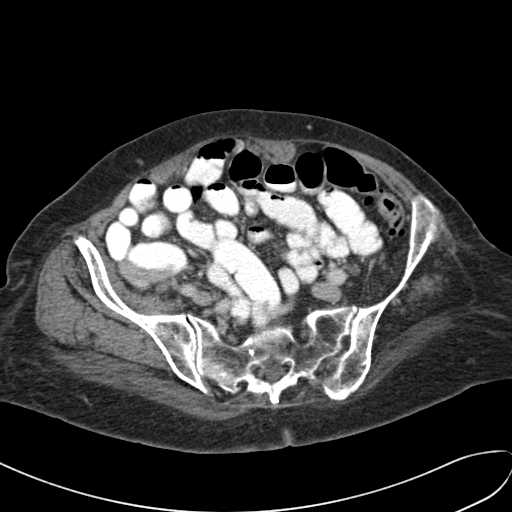
[im 61/140  soft-tissue]
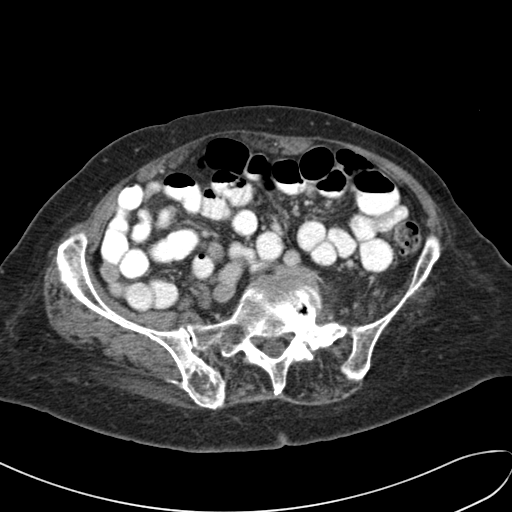
[im 79/140  soft-tissue]
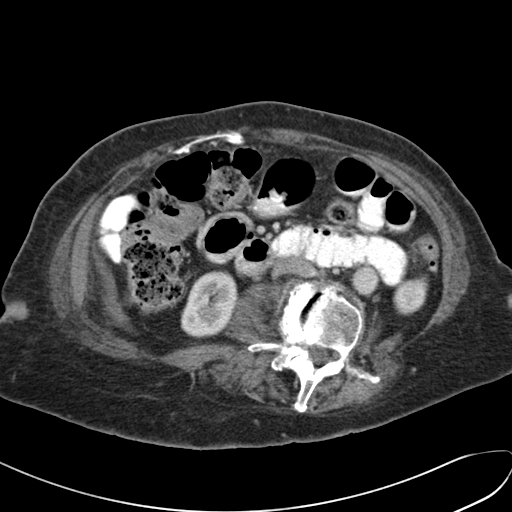
[im 87/140  soft-tissue]
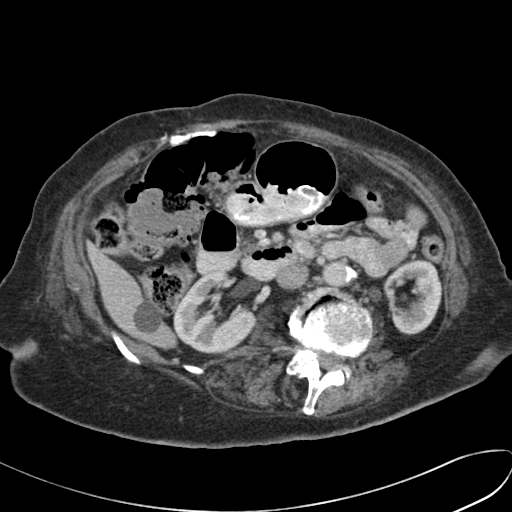
[im 96/140  soft-tissue]
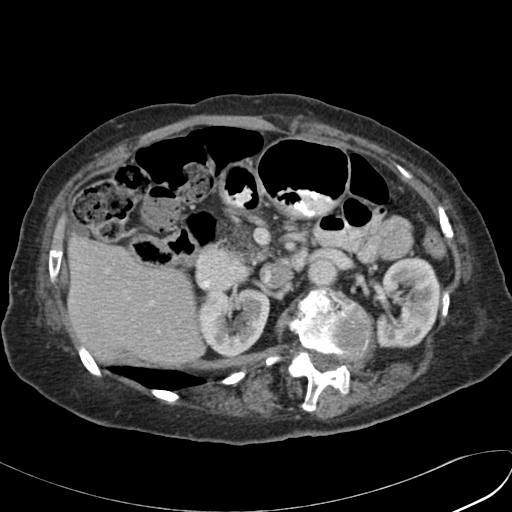
[im 96/140  bone]
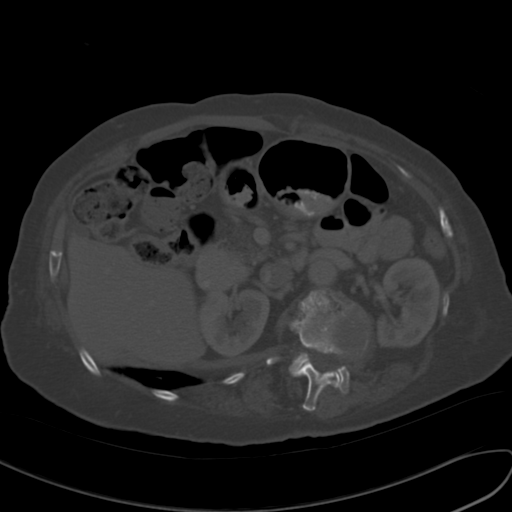
[im 105/140  soft-tissue]
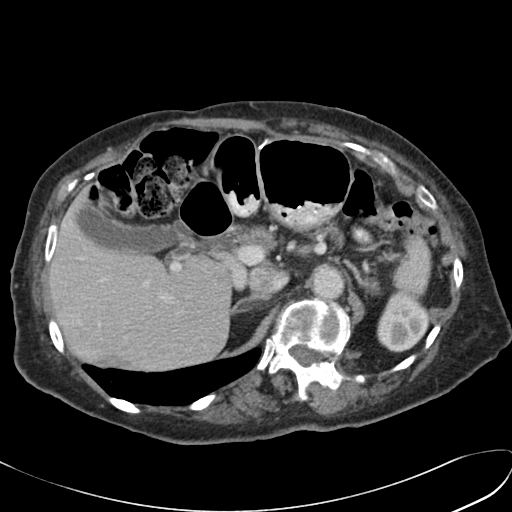
[im 105/140  lung]
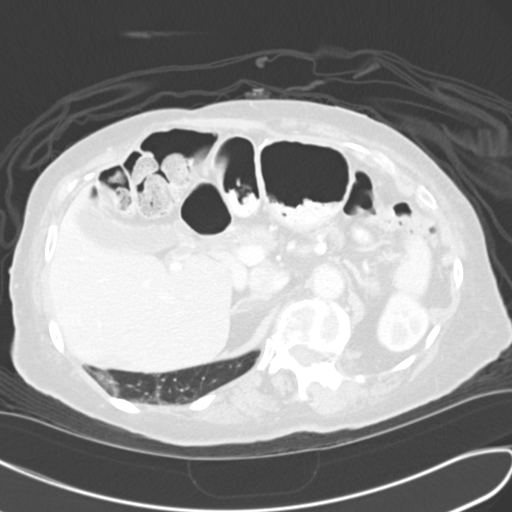
[im 113/140  lung]
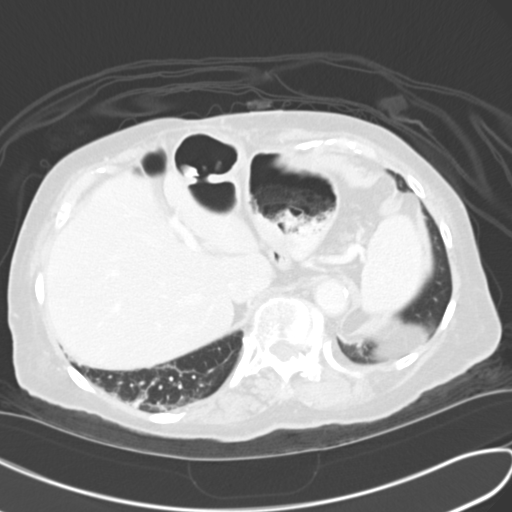
[im 122/140  soft-tissue]
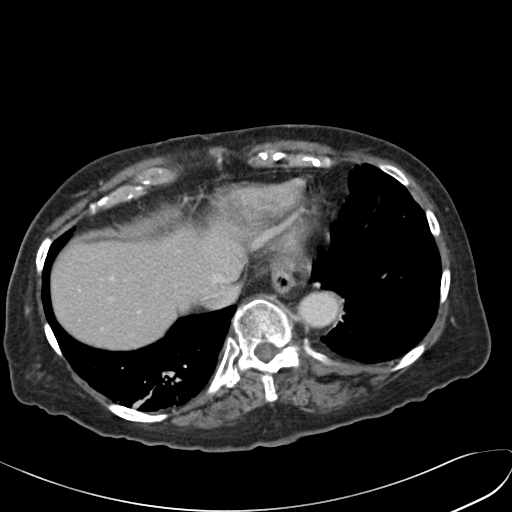
[im 122/140  lung]
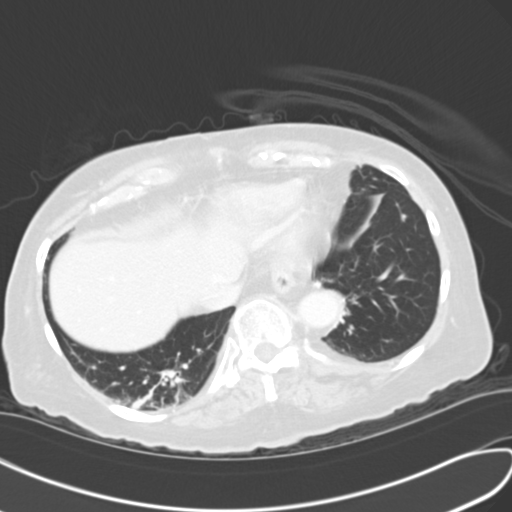
[im 131/140  soft-tissue]
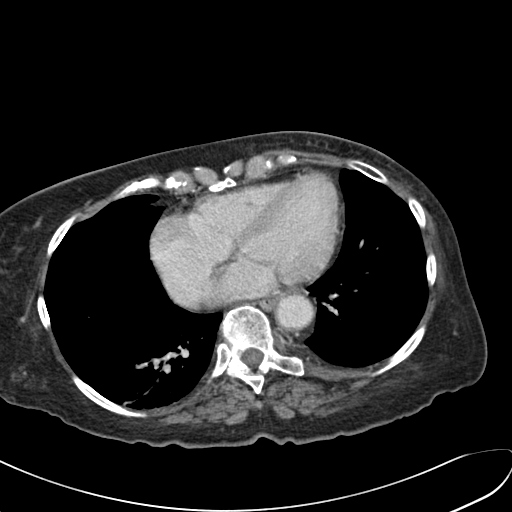
[im 131/140  lung]
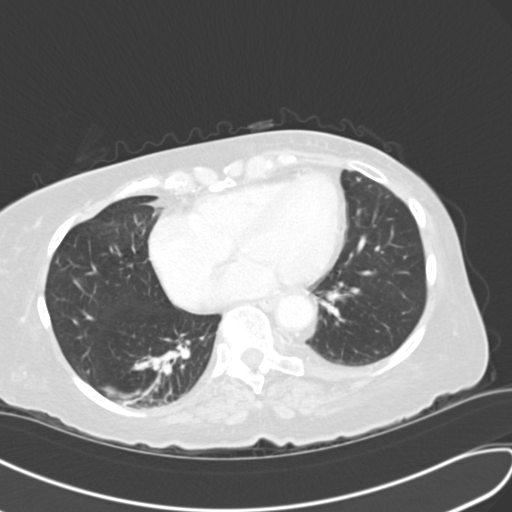

[13 of 32 positions shown; findings below may reference images not displayed]

PROCEDURE:     CT  - CT ABDOMEN / PELVIS  W  - November 21, 2011  [DATE]

RESULT:     Axial CT scanning was performed through the abdomen and pelvis
with reconstructions at 3 mm intervals and slice thicknesses. Review of
multiplanar reconstructed images was performed separately on the VIA
monitor. The patient received 85 cc of [id number] and received oral contrast
as well.

The lung bases exhibit atelectasis on the right posteriorly. The cardiac
chambers are top normal in size. The liver exhibits a stable hypodensity in
the right lobe measuring approximately 1.8 cm in diameter consistent with a
cyst. The gallbladder is mildly distended with no evidence of stones or wall
thickening. As best as can be determined the pancreas exhibits no masses or
inflammatory changes. The spleen is not enlarged. The partially distended
stomach is grossly normal. The kidneys exhibit no evidence of obstruction
nor suspicious masses. I see no adrenal masses. There is severe curvature of
the thoracolumbar spine with the convexity toward the left. There is
degenerative change of the lumbar discs as well. The patient has undergone
previous bilateral total hip joint replacements.

The orally administered contrast has traversed much of the normal calibered
small bowel but has not yet reached the colon. There is a moderate amount of
stool within the ascending and transverse portions of the colon. There is a
subtle transition zone seen in the region of the splenic flexure on images
33 through 41. There are numerous diverticula demonstrated here. The
findings may reflect an area of focal diverticulitis but underlying
malignancy cannot be dogmatically excluded. More distally there are
scattered descending colonic and sigmoid diverticula. Detail of the sigmoid
is limited in the pelvis due to beam hardening artifact. Limited evaluation
of the urinary bladder reveals that it is partially distended. There may be
exophytic fibroid from the uterus. On delayed images contrast within the
renal collecting systems appears normal.
IMPRESSION: 1. The bowel gas pattern may reflect constipation. There does appear to be a
transition zone in the region of the splenic flexure where the caliber of
the bowel is decreased. There are numerous diverticula here in there is wall
thickening. This could reflect focal diverticulitis no an underlying
malignancy cannot be dogmatically excluded. Correlation with patient's
clinical and laboratory values is needed. Surgical and/or oncologic
evaluation may be indicated.
2. More proximally there is a moderate amount of gas and stool within the
right colon which could indicate constipation.
3. I see no acute hepatobiliary abnormality.
4. There is likely atelectasis in the right lower lobe posteriorly.
5. Evaluation of the pelvic structures is limited due to beam hardening
artifact from the prosthetic hip joints.

Dictation location one

## 2013-09-27 IMAGING — CT CT HEAD WITHOUT CONTRAST
2 series · 15 of 30 positions shown, 19 images · non-contrast
Comparison: none

REASON FOR EXAM: fall w head injury,pt on Heparin
COMMENTS:   May transport without cardiac monitor

[Series 2: without · axial · non-contrast · 0.40mm/px · z∈[+904,+1034]mm · 13 of 32 slices shown, 17 images]
[im 3/32  brain]
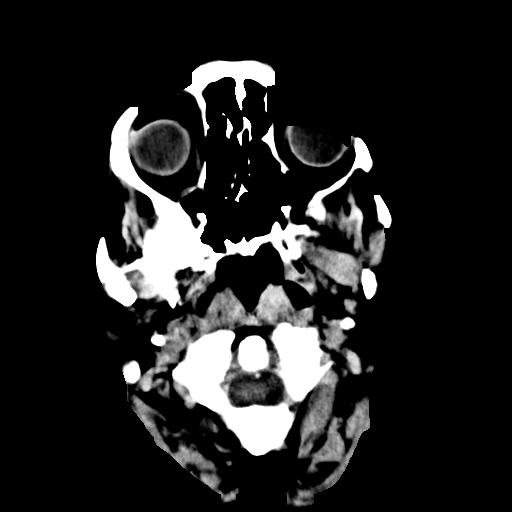
[im 3/32  bone]
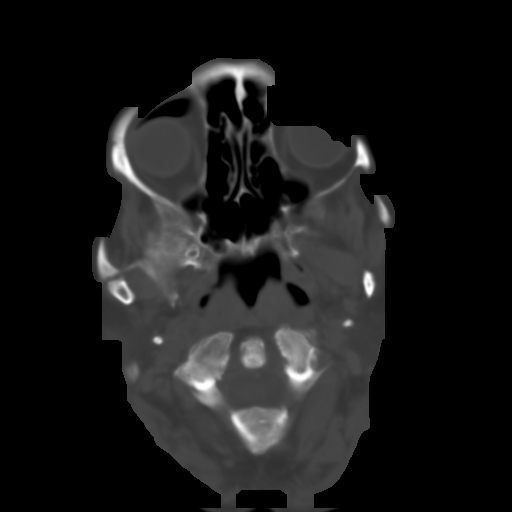
[im 5/32  brain]
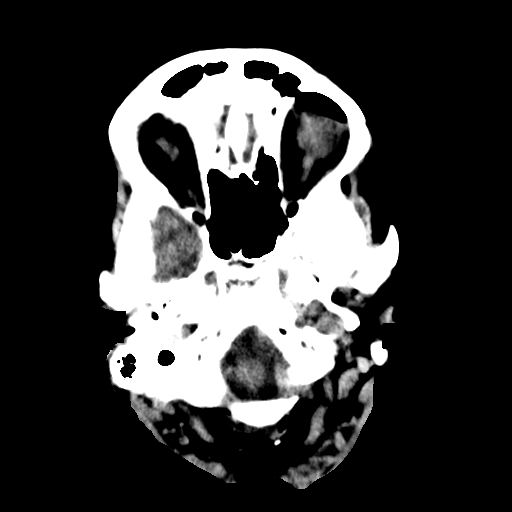
[im 7/32  brain]
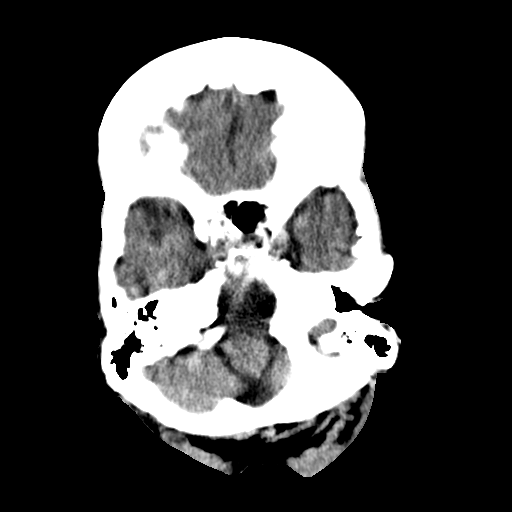
[im 9/32  brain]
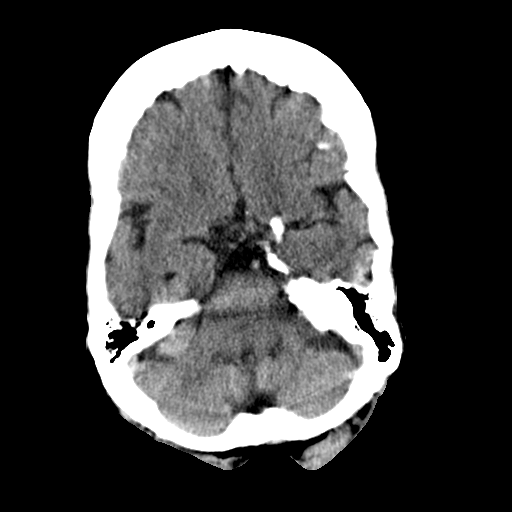
[im 12/32  brain]
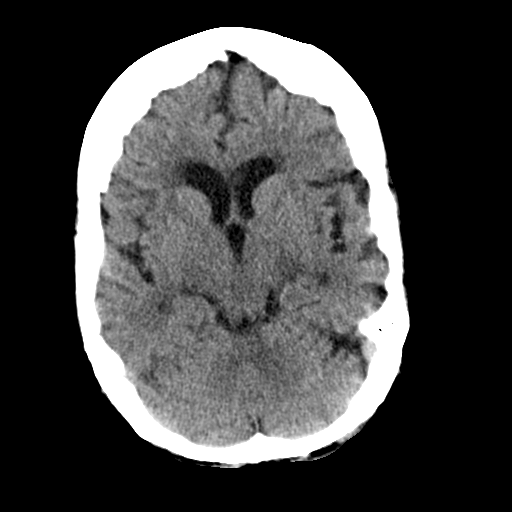
[im 12/32  bone]
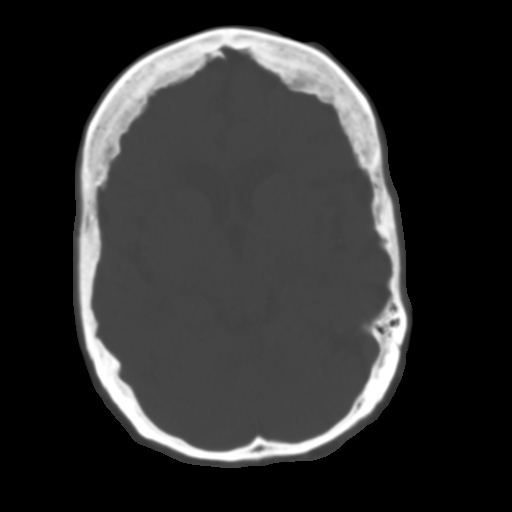
[im 14/32  brain]
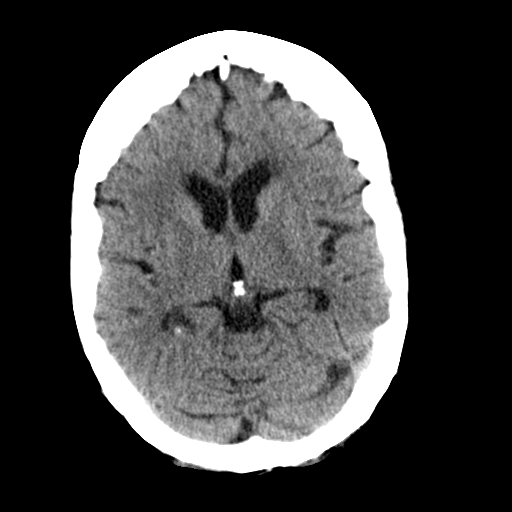
[im 16/32  brain]
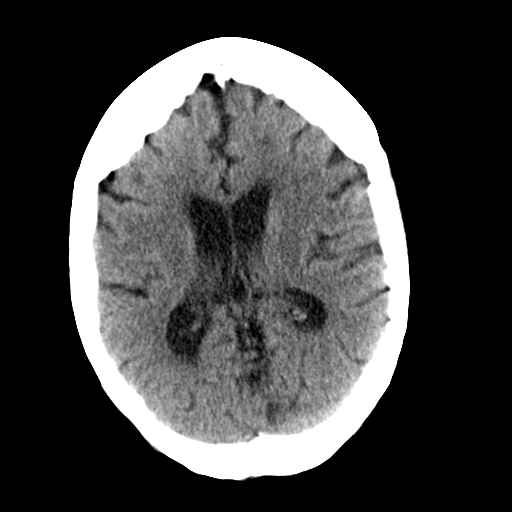
[im 18/32  brain]
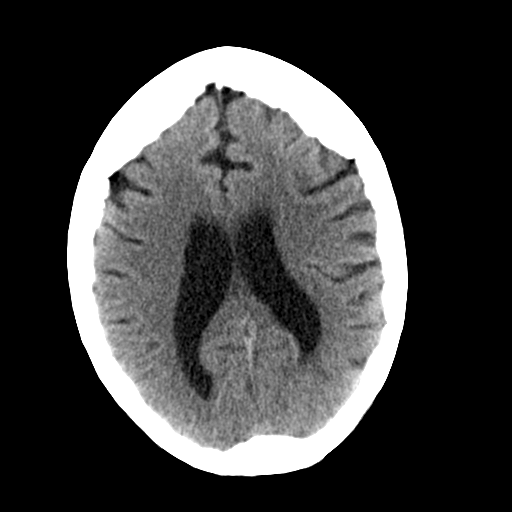
[im 20/32  brain]
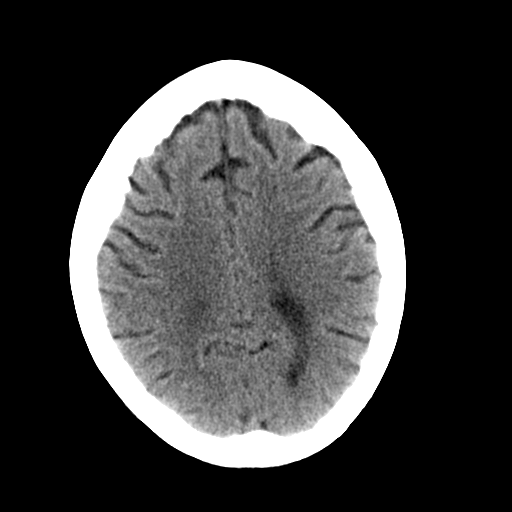
[im 20/32  bone]
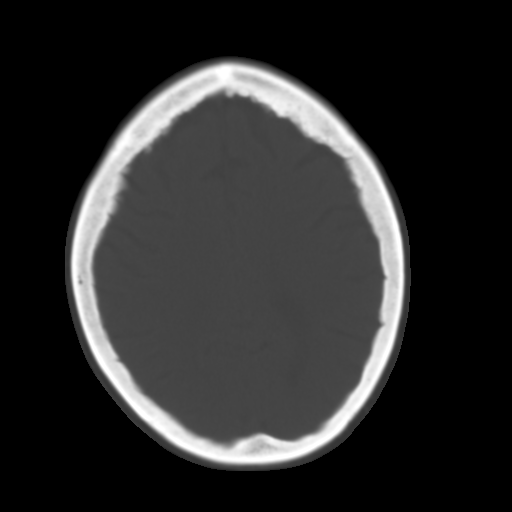
[im 23/32  brain]
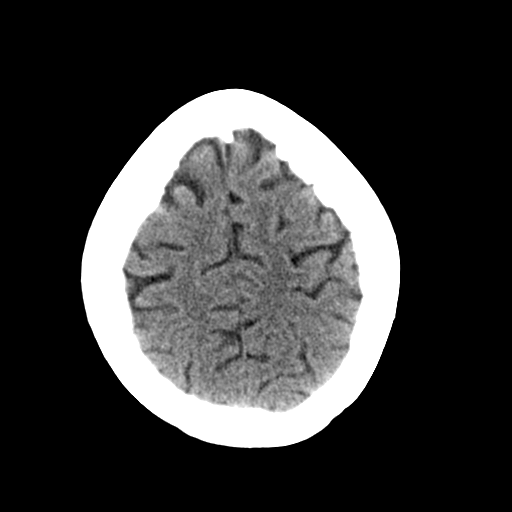
[im 25/32  brain]
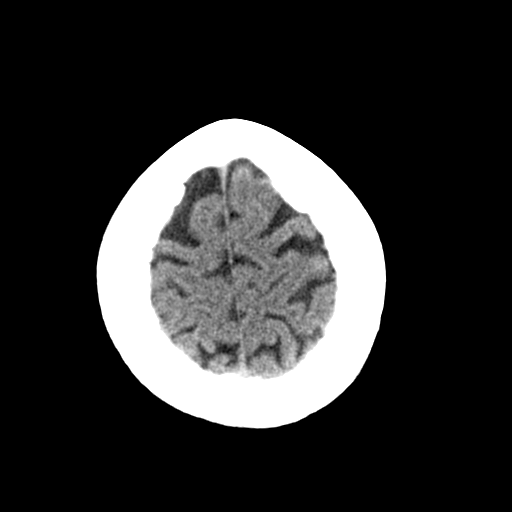
[im 27/32  brain]
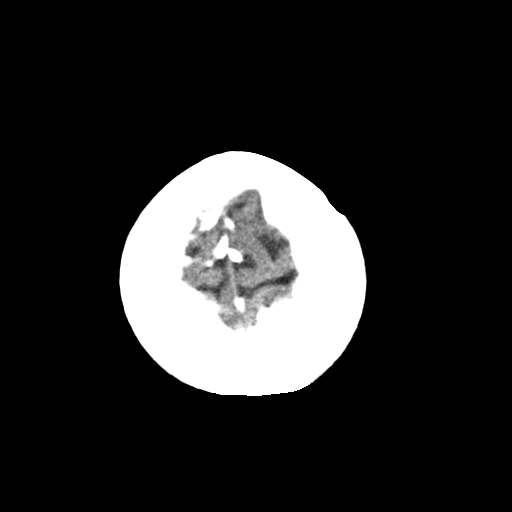
[im 29/32  brain]
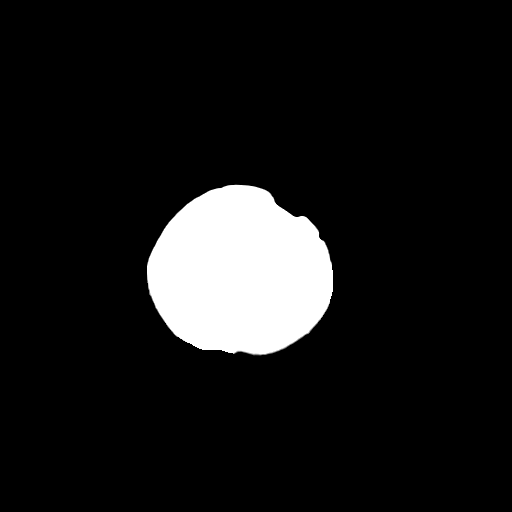
[im 29/32  bone]
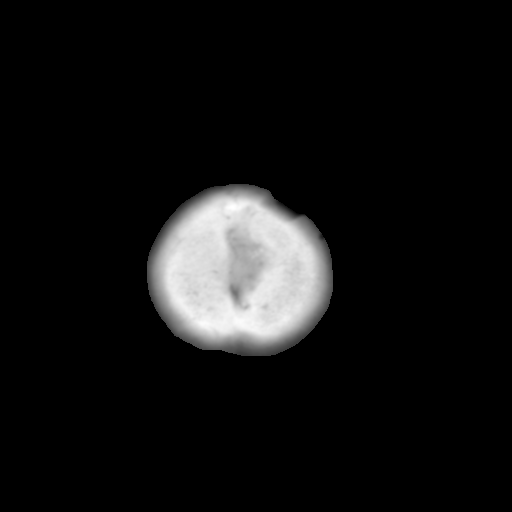

[Series 3: bone · axial · 0.40mm/px · z∈[+904,+924]mm · 2 of 32 slices shown]
[im 3/32  bone]
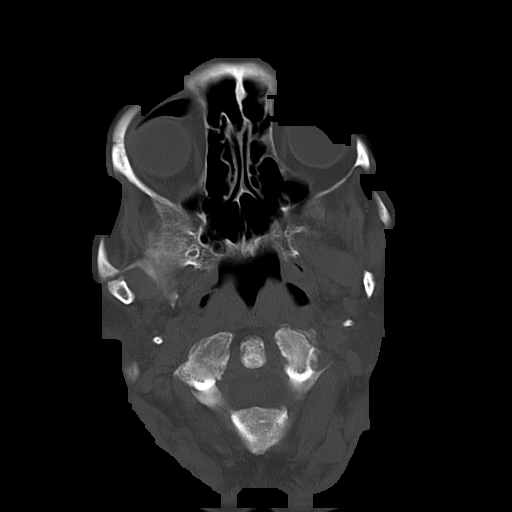
[im 7/32  bone]
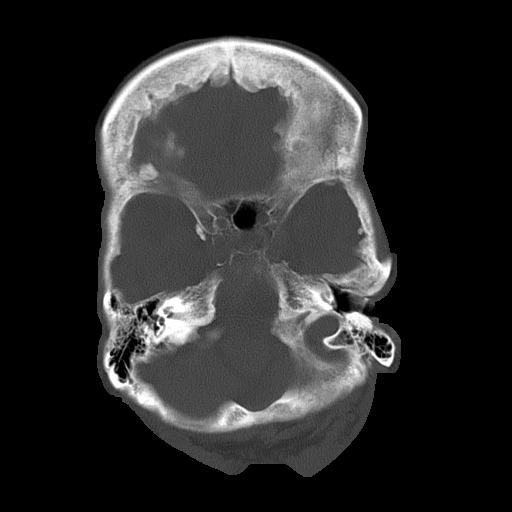

[15 of 30 positions shown; findings below may reference images not displayed]

PROCEDURE:     CT  - CT HEAD WITHOUT CONTRAST  - December 14, 2011 [DATE]

RESULT:     Emergent noncontrast CT of the brain is compared to the previous
examination dated 30 November, 2011.

There is prominence of the ventricles and sulci consistent with atrophy.
Low-attenuation is present within the periventricular and subcortical white
matter most consistent with chronic small vessel ischemic disease. There is
no evidence of intracranial hemorrhage, mass or mass effect. Orthopedic
hardware is seen over the upper cervical region. The included orbits and
sinuses as well as the mastoid air cells appear to be unremarkable. There is
fracture in the calvarial vertex in the midline and toward the left. This
was not present previously. This is best appreciated beginning on image #27
extending to image #31.
IMPRESSION: 1. In the superior calvarial vertex there is cortical discontinuity
suggestive of a small fracture with slight displacement. No underlying
hemorrhage is seen. This appearance was not present previously.
2. Atrophy with chronic small vessel ischemic disease.

[REDACTED]

## 2014-11-15 NOTE — H&P (Signed)
PATIENT NAME:  [name], [name] MR#:  [id number] DATE OF BIRTH:  [birth year]  DATE OF ADMISSION:  12/07/2011  IDENTIFYING INFORMATION AND CHIEF COMPLAINT: The patient is a 80 year old woman who has been on the Medical Service for several days being treated for altered mental status. She had been evaluated by Dr. Jennet MaduroPucilowska for assistance in managing her altered mental status. Dr. Jennet MaduroPucilowska had started psychiatric medicine and thought it was possible that the patient may be having a psychotic or bipolar type illness. Currently, the patient does not appear to be able to go back to her usual living status and needs further stabilization. She had presented back on the 9th to the Emergency Room in a confused state. It appears that she had been calling EMS, making some odd comments. People who knew her at her assisted living facility reported that she had been more confused, making more odd comments. One interesting discovery was that she had several drugs in her urine toxicology screen that really should not have been there. It was possible that she had been getting opiate pain medicine and benzodiazepines and may have been overdoing it with them. Since being in the hospital, she has presented variable mental status. Dr. Jennet MaduroPucilowska describes her having complaints of strange "thoughts" at times. She has also made some paranoid type statements. One other day, she seems to have been giddy and with a somewhat labile mood. Dr. Jennet MaduroPucilowska has started her on Risperdal and Depakote. Dr. Graciela HusbandsKlein, who is her primary care doctor, feels that the patient is still not at her baseline. A full medical work-up has been done, and there is no further indication for acute medical treatment at this point.   PAST PSYCHIATRIC HISTORY: The patient denies, and there is no known past psychiatric illness. [name] unable to describe to me ever being on any medication for psychiatric treatment in the past. No known hospitalizations. No known  psychotic symptoms or depression.   SOCIAL HISTORY: The patient lives alone in an assisted living facility. She has never married, no children. She does have some siblings. Obviously, they are also elderly. It has been reported that the patient may have been somewhat odd in her behavior at other times in her life.   PAST MEDICAL HISTORY: History of multiple medical problems including hip fractures, chronic obstructive pulmonary disease, hypertension. No known brain injury.   SUBSTANCE ABUSE HISTORY: No known history of any abuse of alcohol or drugs. [name] denying that she has been abusing her medicines. She does not seem to probably be getting a very accurate history.  REVIEW OF SYSTEMS: When I saw her today, the patient had no complaints. She did not complain of any pain. She said that her mood was okay. She did not complain of depression, did not complain of hallucinations. She did not complain of anxiety attacks. Appetite was okay. She  said that she was sleeping okay. She denied feeling paranoid.   MENTAL STATUS EXAM: A somewhat ill-looking woman in a chair in a hospital room. Cooperative but rather passive. Eye contact diminished. Psychomotor activity very limited. She barely moves. The patient is dressed in a hospital gown. Speech is understandable but quiet and decreased in production. Thoughts appear slow. Minimal production of thoughts. She does look a little bit confused. She says that her mood feels fine and pretty good right now. She denies knowing of any hallucinations. She denies feeling paranoid. Mini-Mental status exam was done and the patient scored 18. Judgment and insight appear poor.  She specifically cannot remember things often even after a couple of seconds. She cannot remember where she lives, what it is like, or anything about her life, really.   PHYSICAL EXAMINATION:  GENERAL: Elderly woman in a hospital chair. The patient is not able to stand up on her own. According to Dr.  Graciela Husbands, this is baseline for her.   VITAL SIGNS: Temperature of 98.4, pulse 96, respirations 24, blood pressure 111/66.   HEENT: Pupils are equal and reactive. Face is symmetric. Mucosa is dry. Multiple small bruises on her skin. No acute open lesion. Edematous feet both sides up to mid calf. Limited range of motion throughout but especially in the right shoulder. She was not able to stand up and walk at all. She gets pain in her joints when she moves around much.   LUNGS: Clear to auscultation without wheezes.   HEART: Regular rate and rhythm.   ABDOMEN: Nontender, normal bowel sounds.   ASSESSMENT: The patient is 80 year old woman who has presented with new mental status change. To my evaluation today, she appears to simply be demented. [name] not showing any acute mood or psychotic symptoms. However, she has been on Risperdal and Depakote for at least a day at this point. The patient's memory is quite impaired. [name] very limited in her ability to do anything. I cannot imagine her living independently at this point.   TREATMENT PLAN: [name] admitted to Psychiatry for stabilization. We are probably going to have to deal with placement fairly soon. Mood and behavior will be monitored. If there is no worsening in these, I do not see that we need to change any psychiatric medicines any further.   DIAGNOSIS, PRINCIPAL AND PRIMARY:  AXIS I: Psychosis, not otherwise specified.   SECONDARY DIAGNOSES:  AXIS I: Dementia, probably multifactorial, Alzheimer's and vascular at least, possibly dementia from misuse of medications.   AXIS II: No diagnosis.   AXIS III:  1. Arthritis. 2. Chronic limited physical activity. 3. Chronic obstructive pulmonary disease. 4. History of hip fractures in the past. 5. Gastric reflux.   AXIS IV: Severe from limited resources and worsening ability to care for herself.   AXIS V: Functioning at the time of evaluation 30.   ____________________________ Audery Amel, MD jtc:cbb D: 12/07/2011 16:00:26 ET T: 12/07/2011 17:46:00 ET JOB#: [id number]  cc: Audery Amel, MD, <Dictator> Audery Amel MD ELECTRONICALLY SIGNED 12/08/2011 6:53

## 2014-11-15 NOTE — Consult Note (Signed)
PATIENT NAMKalman Shan:  Joyner, Saddie E [id number] OF BIRTH:  [birth year] OF ADMISSION:  11/30/2011 PHYSICIAN:   Lynnea FerrierBert J. Klein, III, MD  CONSULTING PHYSICIAN: Darris Staiger B. Jennet MaduroPucilowska, M.D.  FOR CONSULTATION: To evaluate a patient with encephalopathy.  DATA: Ms. Rayfield CitizenHarzula is a 80 year old female with no past psychiatric history.  COMPLAINT: "Something is wrong with my head."  OF PRESENT ILLNESS: The patient reports that for the past three weeks or so she has been feeling differently. She has strange "thoughts" that she is unable to describe more precisely. [name] not paranoid. She does not have suicidal or homicidal thoughts. There are no auditory or visual hallucinations, yet she thinks that her thinking has been somewhat confused. She remembers that it happened at the time when Dr. Graciela HusbandsKlein prescribed an antibiotic to take three times a day with food. She reports that she had enormous difficulties following his instructions and would be waking up at midnight to drink a glass of milk and take her medications. She has not been consistent taking medicines lately. She has problems remembering. For example, there is one medication that she needs to take half an hour before food, and she says that an hour after her meal she would be still sitting with the pill taken. She has been prescribed pain medication and Xanax but told me that she has not been taking any of it. This is in contrast to her urine toxicology screen that is positive for benzodiazepines, opiates, and tricyclics. On admission to the hospital, the patient was utterly confused and unable to provide any information. Per Nursing staff, she has been behaving strangely, making statements suggestive of paranoia. For example, she believes at some point that she is in the hospital undercover sent by some agency to see how we do. When asked directly, she looked puzzled and was certain that she did not say it to anybody; but apparently the thought was not new to her. She denies any  symptoms of depression, anxiety, symptoms suggestive of bipolar mania or psychosis. She denies alcohol, illicit drugs, or prescription pill abuse.  PSYCHIATRIC HISTORY: The patient denies.  PSYCHIATRIC HISTORY: Her father had been very depressed and cut his wrist so severely that he was unable to use his hand ever since. MEDICAL HISTORY:  1. Hypertension.  2. Dyslipidemia.  Chronic asthma.  Recurrent sinusitis.  Chronic pain syndrome with multiple surgeries. Chronic venostasis.  Recurrent fluid overload.  Osteoarthritis.  Nondisplaced right humeral fracture.  Gastroesophageal bleed.  Ductal carcinoma in situ.  Gastroesophageal reflux disease.  Osteoporosis. Vitamin D B12 deficiency.   ALLERGIES: Augmentin, penicillin, codeine, doxycycline and Flagyl.  ON ADMISSION: Per Nursing staff, there are two separate lists of medications. I am not sure if they were reconciled. The one in the chart says: 1. Allegra 180 mg daily.  2. Baclofen 5 mg t.i.d. as needed.  Cipro 500 mg every 12 hours. 4. Flagyl 500 mg t.i.d. Flagyl is listed on her allergy list. 5. Guaifenesin 400 mg daily as needed.  Meperidine 50 mg, 1/2 tablet to 1 tablet every 6 hours as needed.  Metoclopramide 10 mg t.i.d.  Omeprazole 10 mg daily.  Promethazine 25 mg every 6 hours as needed.  Singulair 10 mg at bedtime.  Theophylline 400 mg, 1/2 pill b.i.d. Tylenol Extra Strength 500 mg daily as needed.  Vicodin 5/500 every 4 to 6 hours as needed.  Zofran 4 mg every 4 hours as needed.  HISTORY: She lives by herself. Apparently she has never been married and has no  children. She has a sister who was her mother's favorite whom she talks to. She has some neighbors that she was talking on the phone with. Also, church people were involved in her admission. The patient states, however, that she is not close to anybody but that she has been doing all right. When asked if she plans to remain in her apartment indefinitely, she stated that she simply has no  money to afford an assisted living facility. [name] a smoker.  OF SYSTEMS: CONSTITUTIONAL: No fevers or chills. Positive for some weight loss. EYES: No double or blurred vision. ENT: No hearing loss. RESPIRATORY: Positive for occasional shortness of breath. CARDIOVASCULAR: No chest pain or orthopnea. GASTROINTESTINAL: No abdominal pain, vomiting, diarrhea. GENITOURINARY: No frequency or dysuria. ENDOCRINOLOGY: No heat or cold intolerance. SKIN: No acne or rashes. MUSCULOSKELETAL: Positive for chronic arthritic pain. NEUROLOGIC: No numbness or tingling. PSYCHIATRIC: See history of present illness for details.  EXAMINATION: SIGNS: Blood pressure 172/74, pulse 108, respirations 18, temperature 98.1.  This is an elderly female with swollen lower extremities, sitting in the chair talking on the phone, in no acute distress. The rest of the physical examination is deferred to her primary attending.  DIAGNOSTIC AND RADIOLOGICAL DATA: are within normal limits except for potassium of 3.3. are within normal limits except for AST of 44. toxicology screen is positive for benzodiazepine, opiates and tricyclics. White blood count 5.9, red blood cells 3.81, hemoglobin 11.5, hematocrit 34.5, platelets 210. on admission: Leukocyte esterase 3+ with 176 white blood cells. This improved to 1+ leukocyte esterase and 4 white blood cells per field. culture: No growth. Sinus tachycardia, inferior infarct sited before.  STATUS EXAMINATION: The patient is alert. [name] oriented to person and place, at times looking at the board, not entirely to situation. She does not remember much from admission and is not a good historian. [name] well groomed. [name] wearing a hospital gown. Her legs are visibly swollen. She sits in a hospital chair. She maintains good eye contact. When I entered the room initially she was on the phone and refused to put it down, asking me to wait. The nurses report that she has been rather bossy and wanting to do  things her way. [name] pleasant, polite, and cooperative during the interview. [name] engaging and interested in our interaction. Her speech is not loud but slightly pressured and a little slurred maybe from dry mouth, at times difficult to understand. Her mood is fine with full affect. Thought processing is slow and confused at times. The patient is unable to answer many questions precisely and has difficulties describing her thoughts or feelings. She denies thoughts of hurting herself or others. She denies paranoid delusions, but per Nursing report she was delusional and paranoid earlier on today. She denies auditory or visual hallucinations. Her cognition is difficult to assess. She retains three out of three and recalls two out of three objects after five minutes. Her insight and judgment are questionable.  RISK ASSESSMENT: This is a patient with no past psychiatric history who has been deteriorating for several weeks for unclear reasons. She has not been able to manage her medications as before. She was found to have a urinalysis suggestive of urinary tract infection. It is possible that she has been misusing her medicines, but she is not actively suicidal.  I:  1. Encephalopathy secondary to medication/medical condition. 2. Rule out benzodiazepine withdrawal delirium.  II: Deferred.  III:  1. Dyslipidemia.  2. Hypothyroidism.  Gastroesophageal reflux disease.  Asthma.  IV: Loss of way of life, primary support.  V: Global Assessment of Functioning 40.   1. We will start low-dose Haldol for symptoms of delirium.  2. We cannot rule out inappropriate use of benzodiazepines and benzodiazepine withdrawal delirium. I will start a very mild Ativan taper 0.5 mg for three doses and 0.25 mg for three doses.  I am out of town this weekend. Please call the  psychiatrist on call if necessary.    Electronic Signatures: Kristine Linea (MD)  (Signed on 13-May-13 16:34)  Authored  Last Updated:  13-May-13 16:34 by Kristine Linea (MD)

## 2014-11-15 NOTE — Consult Note (Signed)
Brief Consult Note: Diagnosis: Mood disorder NOS. R/O Delirium secondary to medical condition/medications.   Patient was seen by consultant.   Consult note dictated.   Recommend further assessment or treatment.   Orders entered.   Discussed with Attending MD.   Comments: [name] has no h/o mental illness. She came tro the hosptal with AMS. She completed a brief benzodiazepine taper. She did not respond to low dose haldol for presumed delirium. [name] no longer on opioids or benzos. She remains paranoid and delusional.   MSE: Alert but oriented to person only. [name] terribly disorganized. She knows she is in the hospital but believes that she was "taken". She denies thoughts of hurting self or others. She deusional and paranoid. There are no hallucinations. I/J are poor.   PLAN: 1. Will d/c low dose Haldo. will start Risperdal 2 mg twice daily for psychosis and depakote 250 mg tid for mood stabilization.  2. She would benefit from geropsychiatry admission. We will try to admit her here if beds available. [name] in agreement and wants to get better.   3. I will follow up.  Electronic Signatures: [name], Jaise Moser (MD)  (Signed 13-May-13 23:23)  Authored: Brief Consult Note   Last Updated: 13-May-13 23:23 by [name], [name] (MD)

## 2014-11-15 NOTE — Discharge Summary (Signed)
PATIENT NAME:  [name], Tashi E MR#:  [id number] DATE OF BIRTH:  April 07, 1935  DATE OF ADMISSION:  12/07/2011 DATE OF DISCHARGE:  12/14/2011  HOSPITAL COURSE: See dictated history and physical for details of admission. This 80 year old woman was transferred from the medical service where she had been seen by psychiatry consultant because of altered mental status. The patient had been displaying changes in cognition and mood of unclear diagnosis. Was appearing to be less capable of normal self care. Transferred to psychiatry for further evaluation and stabilization and treatment. During her time on psychiatry she became more withdrawn and showed less ability at self care. Was not getting out of bed very much. Mood was variable from day to day but appeared to be more dysphoric and down. On one occasion she became very sedated, did not eat probably for most of the day, and had an episode of blood sugar down to 29 at one point. Fortunately, this was picked up by the nursing staff and she was fed and her blood sugar came back up and did not go down again to those levels. About a day after this, she was up out of bed, sitting on a commode when she suffered a fall. She fell forward and hit her head on the floor. She was sent to the Emergency Room for evaluation and had a fractured skull and a very significant laceration to her forehead. She was admitted to the medicine service at that point and discharged from psychiatry. It remained somewhat unclear whether she was suffering from depression although we were treating her for depression at the time of discharge. She was showing some labile changes in her mood and significant dementia.   DISCHARGE MEDICATIONS:  1. Citalopram 30 mg per day.  2. Aripiprazole 2 mg per day.  3. Theophylline 100 mg twice a day.  4. Prilosec 20 mg in the morning.  5. Singulair 10 mg at bedtime.  6. Heparin 5000 units q.12 hours subcutaneous.  7. Advair Diskus 1 puff twice a  day. 8. Cardizem 30 mg 3 times a day. 9. Aspirin 81 mg a day. 10. Albuterol 2 puffs every six hours while awake.  11. Acetaminophen p.r.n.   LABORATORY, DIAGNOSTIC AND RADIOLOGICAL DATA: During the hospital stay she had been initially treated with valproic acid by the consulting psychiatrist. I had discontinued this prior to her fall and her discharge. A Depakote level was done on the 22nd and was 80. She had variable glucose levels with some of them down at one point to as low as 29-30 but for the most part they stayed up around 100. EKG showed left axis deviation and possible old inferior infarct. Albumin low at 3.3. CBC on the 24th showed a white blood count of 16, otherwise normal.   MENTAL STATUS EXAM AT DISCHARGE: At the time of discharge the patient had just fallen off of the commode. Affect was in pain. The patient was not communicating well. Withdrawn. Not reporting suicidal or homicidal ideation, somewhat confused. Dressed in hospital pajamas and appeared to be in some distress. Unclear judgment and insight.   DIAGNOSIS PRINCIPLE AND PRIMARY:  AXIS I: Depression, not otherwise specified.   SECONDARY DIAGNOSES:  AXIS I: Dementia of unclear etiology.   AXIS II: Deferred.   AXIS III: Acute skull fracture and laceration traumatic, chronic obstructive pulmonary disease, high blood pressure.   AXIS IV: Severe, acute from injury.   AXIS V: Functioning at time of discharge 35.    ____________________________ Jackquline DenmarkJohn T.  Ethelyn Cerniglia, MD jtc:rbg D: 12/20/2011 23:31:07 ET T: 12/21/2011 10:50:05 ET JOB#: [id number]  cc: Audery Amel, MD, <Dictator> Audery Amel MD ELECTRONICALLY SIGNED 12/21/2011 11:11

## 2014-11-15 NOTE — Consult Note (Signed)
General Aspect 80 yo female with recent admission to psychiatry for suicide ideation and depression. Had admission in the past for alterred mental status and was noted to have tachycardia. Was placed on cardizem for this. Had an episode of syncope causing a scalp laceration and skull fracture. Was transferred to telemtry. Telemetry revealed sr with intermittant episodes of second degree and third degree heart block with several  pauses. Cardizem has been discontinued   Physical Exam:   GEN disheveled    HEENT PERRL    NECK supple    RESP no use of accessory muscles    CARD Normal, S1, S2  2nd and 3rd degree heart block    ABD denies tenderness  normal BS    LYMPH negative neck    EXTR negative edema    SKIN head lac on left forehead    NEURO cranial nerves intact, motor/sensory function intact    PSYCH alert, depressed   Review of Systems:   Subjective/Chief Complaint head ache    General: Fatigue    Skin: head lac    ENT: No Complaints    Eyes: No Complaints    Neck: No Complaints    Respiratory: No Complaints    Cardiovascular: No Complaints    Gastrointestinal: No Complaints    Genitourinary: No Complaints    Vascular: No Complaints    Musculoskeletal: No Complaints    Neurologic: Dizzness  Fainting    Hematologic: No Complaints    Endocrine: No Complaints    Psychiatric: No Complaints    Review of Systems: All other systems were reviewed and found to be negative    Medications/Allergies Reviewed Medications/Allergies reviewed     irritable bowel syndrome:    Cancer, Breast:    asthma:    GI bleed: Aug 2006   left great toe arthrodesis (hammer Toe): 26-Apr-2007   cervical fusion after fx 2002:    nissan fundiplication 1993:    broken right leg: 2006   bilateral knee and hip replacement: 2003   fractured cervical spine: Dec 2002  Home Medications: Medication Instructions Status  omeprazole 20 mg oral delayed release capsule 1  cap(s) orally once a day Active  theophylline 100 mg oral tablet, extended release 1 tab(s) orally 2 times a day Active  Singulair 10 mg oral tablet 1 tab(s) orally once a day (in the evening) Active  Aspirin Low Dose 81 mg oral delayed release tablet 1 tab(s) orally once a day Active  Advair Diskus 100 mcg-50 mcg inhalation powder 1 puff(s) inhaled 2 times a day Active  Depakote ER 250 mg oral tablet, extended release 1 tab(s) orally 3 times a day with meals Active  Risperdal 3 mg oral tablet 1 tab(s) orally 2 times a day Active  Cardizem 30 mg oral tablet 1 tab(s) orally 3 times a day Active  Allegra 24 Hour Allergy oral tablet 1 tab(s) orally once a day Active  baclofen 5 milligram(s) orally 3 times a day, As Needed- for Pain  Active  metoclopramide 10 mg oral tablet 1 tab(s) orally 3 times a day (before meals) Active   EKG:   Interpretation with intermitant 2nd and 3rd degree heart block    Codeine: Unknown  Tape: Unknown  Flagyl: GI Distress  Augmentin ES-600: GI Distress  Doxycycline: Other  penicillins: Other    Impression 80 yo female with history of depression and suicide ideation who is s/p syncopal episode this am causing head lac and skull fracture. Telemetry has revealed  sinus rhythm with intermittant 2nd and 3rd degreee heart block. Was on low dose cardizem for tachycardia which has been discontinued. Currently hemodyanically stable with intermittant brief episodes of 2nd and 3rd degree heart block. Discussed ppm with patient and she agrees.    Plan 1. Hold av node drugs 2. Hold lovenox  3. Risk and benefits of ppm discussed with patient 4. Consider ppm later today.   Electronic Signatures: Dalia HeadingFath, Bobbiejo Ishikawa A (MD)  (Signed [phone]-May-13 08:49)  Authored: General Aspect/Present Illness, History and Physical Exam, Review of System, Past Medical History, Home Medications, EKG , Allergies, Impression/Plan   Last Updated: 24-May-13 08:49 by Dalia HeadingFath, Talea Manges A (MD)

## 2014-11-15 NOTE — H&P (Signed)
PATIENT NAME:  [name], Majesty E MR#:  [id number] DATE OF BIRTH:  [birth year]  DATE OF ADMISSION:  11/30/2011  PRIMARY CARE PHYSICIAN: Curtis SitesBert J. Klein, III, MD   HISTORY OF PRESENT ILLNESS: The patient is a 80 year old Caucasian female with past medical history significant for history of hypertension, vitamin D deficiency, hyperlipidemia, dysphagia, gastroesophageal reflux disease, also history of constipation, abdominal pains and diverticulitis diagnosed in the beginning of May 2013 and was placed on Cipro and Flagyl, presented to the hospital with complaints of confusion. Apparently the patient was found and sent to the Emergency Room via EMS for nerves. Apparently the patient called EMS due to her nerves, and she called at least twice; and she felt that something was not right with her for the past three weeks. She was not able to explain and was brought to the Emergency Room for further evaluation. She lives in an assisted living facility. She was very confused, and when she arrived in the Emergency Room she was not clear where she was coming from and for what reason. She was very confused and not able to provide much history. The Emergency Room physician was called by the patient's priest or one of clergy members, and apparently it was noted that she takes too much of her medications. The patient's urine drug screen was positive for multiple substances such as benzodiazepines, also opiates as well as tricyclic antidepressants; and because of concerns the patient has metabolic encephalopathy related to overdose of medications Hospitalist Services were contacted for admission.  The patient herself is not able to provide much history except she is not really sure why she is here. She stated that she noted that she was awakened by herself talking, ranting about things; and that was not what she usually does. She was very concerned, and that is why she called Emergency Medical Services.   PAST MEDICAL HISTORY:  [facility], the patient has had: 1. Nondisplaced right humeral fracture. 2. History of osteoarthritis with left foot surgery.  3. History of chronic venostasis and recurrent fluid overload. Consultation with [facility] was arranged in June 2008. The patient had an echocardiogram in June 2008 that showed preserved left ventricular function, mild mitral regurgitation, as well as normal right ventricular pressures.  4. History of chronic asthma, followed by Dr. Meredeth IdeFleming.  5. History of chronic pain syndrome with multiple previous surgeries. 6. Hypertension.  7. Hyperlipidemia.  8. Recurrent sinusitis.  9. History of ventral hernia.  10. Hospitalizations for recurrent GI bleeding. There is evidence of gastric ulcerations in the past thought to be due to anti-inflammatory abuse. Gastric emptying study did show very poor gastric outflow evaluated by Dr. Maryruth BunKapur in the past.  11. 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  12. History of gastroesophageal reflux disease. Barium swallow study done in August 2009 showed paraesophageal hernia as well as gastric dysmotility, followed by GI.  13. Osteoporosis.  14. Vitamin D deficiency.   PAST SURGICAL HISTORY:  1. Status post bilateral knee surgery. 2. Status post Nissen fundoplication. 3. History of neck surgery.  4. Status post bilateral cataract repairs. 5. Right hip surgery. 6. Left foot reconstruction in 2008 by Dr. Orland Jarredroxler.  7. Foot surgery in 2012.   MEDICATIONS: According to medical records, the patient is on multiple medications including: 1. Allegra 180 mg p.o. daily.  2. Baclofen 5 mg p.o. t.i.d. as needed.  3. Ciprofloxacin 500  mg every 12 hours and Flagyl 500 mg t.i.d. It is unclear if she is taking those medications still.  4. Guaifenesin 400 mg p.o. daily as needed.  5. Meperidine 50 mg, 1/2 tablet to 1 tablet every 6 hours as needed.   6. Metoclopramide 10 mg t.i.d.  7. Omeprazole 10 mg p.o. daily.  8. Promethazine 25 mg every 6 hours as needed.  9. Singulair 10 mg p.o. bedtime.  10. Theophylline 400 mg in 24 hours extended release, 1/2 pill twice daily. 11. Tylenol Extra Strength 500 mg once daily as needed.  12. Vicodin 5/500, 1 tablet every 4 to 6 hours as needed. 13. Zofran 4 mg p.o. every 4 hours as needed.   ALLERGIES: Multiple-Augmentin gives her GI distress, penicillin   unspecified, codeine sulfate-nausea, doxycycline-intolerant, Flagyl-GI distress.   SOCIAL HISTORY: She does not drink or smoke. She used to smoke in the past but only insignificant amount, according to the patient approximately 1 pack in 1 week. It is unclear how long ago she quit because she is not able to tell me.   FAMILY HISTORY: Family history is not available from her. From medical records, apparently diabetes, HTN  as well as coronary artery disease.   REVIEW OF SYSTEMS: Positive for weight loss for the past two years. Trifocal glasses. One month ago she was seen by an eye doctor who felt that the patient probably has some macular degeneration. She admits of having some year-round allergies, sinus congestion intermittently, some wheezing in her lungs, also shortness of breath whenever she walks around. The patient feels that her left lower extremity is shorter than the right one, has lower extremity swelling which seems to be chronic. Intermittent nausea as well as intermittent diarrhea as well as burping. Right shoulder pains as well as scaling skin. CONSTITUTIONAL: Denies fevers or chills, weakness . EYES: In regards to eyes, denies any blurry vision, double vision, glaucoma, or cataracts. ENT: Denies any tinnitus, allergies, epistaxis, sinus pain, dentures, difficulty swallowing. RESPIRATORY: Denies any hemoptysis, asthma, or chronic obstructive pulmonary disease. CARDIOVASCULAR: Denies chest pains, orthopnea, arrhythmias, palpitations or  syncope. GASTROINTESTINAL: Denies any vomiting. Admits of diarrhea. Denies abdominal pains. No hematemesis, rectal bleeding, change in bowel habits. GENITOURINARY: Denies dysuria, hematuria, frequency, or incontinence. ENDOCRINOLOGY: Denies any polydipsia, nocturia, thyroid problems, heat or cold intolerance. SKIN: Denies any acne, rashes, lesions, or change in moles. MUSCULOSKELETAL: Denies arthritis, cramps, swelling, or gout. NEUROLOGICAL: Denies numbness, epilepsy, or tremor. PSYCHIATRIC: Denies anxiety, insomnia, or depression.    PHYSICAL EXAMINATION:  VITAL SIGNS: On arrival the hospital, temperature is 97.8, pulse 100, respiratory rate 18, blood pressure 155/6, saturation 94% on room air.   GENERAL: Well-developed, well-nourished Caucasian female, somewhat pale and sitting on the stretcher.   HEENT: Her pupils are equal and reactive to light. Extraocular movements are intact. No icterus or conjunctivitis. Normal hearing.  No pharyngeal erythema. Mucosa is moist.   NECK: No masses, supple, nontender. Thyroid is not enlarged. Carotid is not enlarged. No adenopathy, no jugular venous distention, no carotid bruits.  Full range of motion.   LUNGS: Clear to auscultation. A few wheezes were heard, a few rhonchi, but otherwise no labored inspirations, no dullness to percussion or overt respiratory distress.   CARDIOVASCULAR: S1, S2.  No murmur, gallop or rub noted. PMI is not lateralized. Chest is nontender to palpation. Pedal pulses 1+, trace lower extremity edema; however, the patient does have quite significant tissue mass which is probably chronic tissue mass in her lower extremities.  ABDOMEN: Soft, nontender. Bowel sounds are present. No hepatosplenomegaly or masses are noted.   RECTAL: Deferred.   MUSCULOSKELETAL: Muscle strength: Able to move all extremities. No cyanosis, degenerative joint disease, or kyphosis.   SKIN: Skin did not reveal any rashes, lesions, erythema, nodularity, or  induration. It was warm and dry to palpation  .   LYMPH: No adenopathy in the cervical region.   NEUROLOGICAL: Cranial nerves are grossly intact. Sensory is intact. No dysarthria or aphasia. The patient is alert, disoriented where she is. Memory is poor. [name] confused, and she is somewhat depressed sitting on the stretcher.   LABORATORY, DIAGNOSTIC AND RADIOLOGICAL DATA:  BMP: Glucose 121, potassium 3.4, otherwise unremarkable BMP.  Liver enzymes: Albumin level 3.1, AST elevated to 40, otherwise unremarkable.  The patient's urine drug screen was positive for benzodiazepines as well as opiates and TCAs.  CBC: White blood cell count 6.1, hemoglobin 11.9, platelet count 218. Absolute neutrophil count is normal at 4.4.  Urinalysis: Clear yellow urine, negative for glucose or bilirubin, 1+ ketones, specific gravity 1.016 specific gravity, 6.0 pH, negative for blood, protein, nitrates, 1+ leukocyte esterase, 1 red blood cell, 4 white blood cells, trace bacteria were noted. Mucus is present. EKG is not done.  CT of head without contrast due to altered mental status showed no acute abnormality.   ASSESSMENT AND PLAN:  1. Encephalopathy of unclear etiology: Possibly related to combination of drugs such as opiates, TCAs, as well as benzodiazepines which could give the patient significant encephalopathy. We will hold all of those medications. We will follow the patient's neurological status. The patient's admission will be observation unless other problems arise.  2. Hypokalemia: Supplement in IV.  3. Hyperglycemia: Check hemoglobin A1c.  4. Elevated transaminases: Get cardiac enzymes x3, checking CK levels.  5. Anemia: Guaiac.  6. Questionable urinary tract infection: Get urine cultures. We will not start antibiotics unless culture results are positive.   TIME SPENT: 50 minutes.   ____________________________ Katharina Caper, MD rv:cbb D: 11/30/2011 18:04:32 ET T: 11/30/2011 18:44:49  ET JOB#: [id number]  cc: Katharina Caper, MD, <Dictator> Lynnea Ferrier, MD Sherria Riemann MD ELECTRONICALLY SIGNED 12/02/2011 17:09

## 2014-11-15 NOTE — Discharge Summary (Signed)
PATIENT NAMKalman [name]:  [name], [name] MR#:  [id number] DATE OF BIRTH:  10-12-1934  DATE OF ADMISSION:  12/02/2011 DATE OF DISCHARGE:  12/07/2011  FINAL DIAGNOSES:  1. Altered mental status, likely secondary to medication overuse and metabolic encephalopathy.  2. Psychiatric disorder, not otherwise specified, possible bipolar mania.  3. Hypertension.  4. Osteoarthritis.  5. Chronic obstructive pulmonary disease/asthma.  6. Chronic pain syndrome.  7. Chronic venous stasis.  8. Hyperlipidemia.  9. Recurrent sinusitis.  10. History of gastroesophageal ulceration secondary to anti-inflammatory abuse.  11. History of ductal carcinoma in situ with prior radiation therapy 2007.  12. Gastroesophageal reflux disease with gastric dysmotility and paraesophageal hernia.  13. Osteoporosis.  14. Vitamin D deficiency.  15. History of bilateral knee surgery.  16. History of Nissen fundoplication.  17. History of bilateral cataract repairs.  18. History of neck surgery.  19. History of right hip surgery.  20. Left foot reconstruction.  21. Left foot surgery 2012.   HISTORY AND PHYSICAL: Please see dictated admission history and physical.   SUMMARY OF HOSPITAL COURSE: Patient was admitted with confusion, the details surrounding this are difficult to put together. Her mental status did appear to improve, and extensive testing was performed, which did not reveal a clear etiology. It was felt there was a combination of things which led to metabolic encephalopathy, likely from patient taking her medications by prescription and possibly either overdosing accidentally, or combining them in a way not prescribed, as well as some underlying dementia and some underlying psychiatric disorder. Neurology saw the patient, felt that she may have some dementia and recommended consider trial of Aricept, however, given patient already having altered mental status and the nature of Aricept being to maintain memory unit rather than  improve it, the thought was that this might be better if started as an outpatient.   Psychiatry saw the patient and felt that she was delusional and paranoid, which did have some waxing and waning during this hospitalization. She was placed on Risperdal and Depakote for mood stabilization and decrease of anxiety, and she did show some improvement with this, however, continued to be confused and have some difficulty with reality and so the thought was that she would benefit from psychiatric inpatient admission. Patient was transferred to the inpatient psychiatric unit in stable condition with her physical activity to be up with a walker as tolerated.   DIET: Her diet was to be no added salt.   FOLLOW UP: Follow up will be arranged after discharge from the inpatient psychiatric service, we would like to see her in the next couple of weeks after this in our office.   DISCHARGE MEDICATIONS:  1. Singulair 10 mg p.o. at bedtime.  2. Omeprazole 20 mg p.o. daily.  3. Enteric-coated aspirin 81 mg p.o. daily.  4. Theophylline 100 mg p.o. b.i.d.  5. Advair 100/50, 1 puff b.i.d.  6. Albuterol metered-dose inhaler 2 puffs 4 times a day.  7. Depakote 250 mg p.o. t.i.d. with meals.  8. Risperdal 3 mg p.o. b.i.d.  9. Cardizem 30 mg p.o. t.i.d.   ____________________________ Lynnea FerrierBert J. Klein III, MD bjk:cms D: 12/07/2011 12:51:48 ET T: 12/07/2011 13:08:10 ET JOB#: [id number]  cc: Curtis SitesBert J. Klein III, MD, <Dictator> Daniel NonesBERT KLEIN MD ELECTRONICALLY SIGNED 12/20/2011 7:42

## 2014-11-15 NOTE — Consult Note (Signed)
Brief Consult Note: Diagnosis: depression nos.   Patient was seen by consultant.   Consult note dictated.   Recommend further assessment or treatment.   Orders entered.   Comments: Patient seen. Was on my service on psychiatry. Very flat today. No eye contact. Keeps talking about feeling bad but not clear what she means. I had been trying to stop everything that might be sedating before this happened. Will dc the depakote - not clearly indicated - and restart lexapro (in case even low dose celexa causes cardiac worries) but no other psych med for now.  Electronic Signatures: Audery Amellapacs, Kyrianna Barletta T (MD)  (Signed 24-May-13 18:10)  Authored: Brief Consult Note   Last Updated: 24-May-13 18:10 by Audery Amellapacs, Kylon Philbrook T (MD)

## 2014-11-15 NOTE — H&P (Signed)
PATIENT NAME:  [name], [name] MR#:  [id number] DATE OF BIRTH:  Apr 19, 1935  DATE OF CONSULTATION:  11/30/2011  REFERRING PHYSICIAN:   Lynnea Ferrier, MD  CONSULTING PHYSICIAN: Shloime Keilman B. Jennet Maduro, M.D.   REASON FOR CONSULTATION: To evaluate a patient with encephalopathy.   IDENTIFYING DATA: [name] is a 80 year old female with no past psychiatric history.   CHIEF COMPLAINT: "Something is wrong with my head."   HISTORY OF PRESENT ILLNESS: The patient reports that for the past three weeks or so she has been feeling differently. She has strange "thoughts" that she is unable to describe more precisely. [name] not paranoid. She does not have suicidal or homicidal thoughts. There are no auditory or visual hallucinations, yet she thinks that her thinking has been somewhat confused. She remembers that it happened at the time when Dr. Graciela Husbands prescribed an antibiotic to take three times a day with food. She reports that she had enormous difficulties following his instructions and would be waking up at midnight to drink a glass of milk and take her medications. She has not been consistent taking medicines lately. She has problems remembering. For example, there is one medication that she needs to take half an hour before food, and she says that an hour after her meal she would be still sitting with the pill taken. She has been prescribed pain medication and Xanax but told me that she has not been taking any of it. This is in contrast to her urine toxicology screen that is positive for benzodiazepines, opiates, and tricyclics. On admission to the hospital, the patient was utterly confused and unable to provide any information. Per Nursing staff, she has been behaving strangely, making statements suggestive of paranoia. For example, she believes at some point that she is in the hospital undercover sent by some agency to see how we do. When asked directly, she looked puzzled and was certain that she did not say  it to anybody; but apparently the thought was not new to her. She denies any symptoms of depression, anxiety, symptoms suggestive of bipolar mania or psychosis. She denies alcohol, illicit drugs, or prescription pill abuse.   PAST PSYCHIATRIC HISTORY: The patient denies.   FAMILY PSYCHIATRIC HISTORY: Her father had been very depressed and cut his wrist so severely that he was unable to use his hand ever since.   PAST MEDICAL HISTORY:  1. Hypertension.  2. Dyslipidemia.  3. Chronic asthma.  4. Recurrent sinusitis.  5. Chronic pain syndrome with multiple surgeries. 6. Chronic venostasis.  7. Recurrent fluid overload.  8. Osteoarthritis.  9. Nondisplaced right humeral fracture.  10. Gastroesophageal bleed.  11. Ductal carcinoma in situ.  12. Gastroesophageal reflux disease.  13. Osteoporosis. 14. Vitamin D B12 deficiency.   ALLERGIES: Augmentin, penicillin, codeine, doxycycline and Flagyl.   MEDICATIONS ON ADMISSION: Per Nursing staff, there are two separate lists of medications. I am not sure if they were reconciled. The one in the chart says: 1. Allegra 180 mg daily.  2. Baclofen 5 mg t.i.d. as needed.  3. Cipro 500 mg every 12 hours. 4. Flagyl 500 mg t.i.d. Flagyl is listed on her allergy list. 5. Guaifenesin 400 mg daily as needed.  6. Meperidine 50 mg, 1/2 tablet to 1 tablet every 6 hours as needed.  7. Metoclopramide 10 mg t.i.d.  8. Omeprazole 10 mg daily.  9. Promethazine 25 mg every 6 hours as needed.  10. Singulair 10 mg at bedtime.  11. Theophylline 400 mg, 1/2  pill b.i.d. 12. Tylenol Extra Strength 500 mg daily as needed.  13. Vicodin 5/500 every 4 to 6 hours as needed.  14. Zofran 4 mg every 4 hours as needed.   SOCIAL HISTORY: She lives by herself. Apparently she has never been married and has no children. She has a sister who was her mother's favorite whom she talks to. She has some neighbors that she was talking on the phone with. Also, church people were involved  in her admission. The patient states, however, that she is not close to anybody but that she has been doing all right. When asked if she plans to remain in her apartment indefinitely, she stated that she simply has no money to afford an assisted living facility. [name] a smoker.   REVIEW OF SYSTEMS: CONSTITUTIONAL: No fevers or chills. Positive for some weight loss. EYES: No double or blurred vision. ENT: No hearing loss. RESPIRATORY: Positive for occasional shortness of breath. CARDIOVASCULAR: No chest pain or orthopnea. GASTROINTESTINAL: No abdominal pain, vomiting, diarrhea. GENITOURINARY: No frequency or dysuria. ENDOCRINOLOGY: No heat or cold intolerance. SKIN: No acne or rashes. MUSCULOSKELETAL: Positive for chronic arthritic pain. NEUROLOGIC: No numbness or tingling. PSYCHIATRIC: See history of present illness for details.   PHYSICAL EXAMINATION:  VITAL SIGNS: Blood pressure 172/74, pulse 108, respirations 18, temperature 98.1.   GENERAL: This is an elderly female with swollen lower extremities, sitting in the chair talking on the phone, in no acute distress. The rest of the physical examination is deferred to her primary attending.   LABORATORY, DIAGNOSTIC AND RADIOLOGICAL DATA:  Chemistries are within normal limits except for potassium of 3.3.  LFTs are within normal limits except for AST of 44.  Urine toxicology screen is positive for benzodiazepine, opiates and tricyclics.  CBC: White blood count 5.9, red blood cells 3.81, hemoglobin 11.5, hematocrit 34.5, platelets 210.  Urinalysis on admission: Leukocyte esterase 3+ with 176 white blood cells. This improved to 1+ leukocyte esterase and 4 white blood cells per field.  Urine culture: No growth.  EKG: Sinus tachycardia, inferior infarct sited before.   MENTAL STATUS EXAMINATION: The patient is alert. [name] oriented to person and place, at times looking at the board, not entirely to situation. She does not remember much from admission and  is not a good historian. [name] well groomed. [name] wearing a hospital gown. Her legs are visibly swollen. She sits in a hospital chair. She maintains good eye contact. When I entered the room initially she was on the phone and refused to put it down, asking me to wait. The nurses report that she has been rather bossy and wanting to do things her way. [name] pleasant, polite, and cooperative during the interview. [name] engaging and interested in our interaction. Her speech is not loud but slightly pressured and a little slurred maybe from dry mouth, at times difficult to understand. Her mood is fine with full affect. Thought processing is slow and confused at times. The patient is unable to answer many questions precisely and has difficulties describing her thoughts or feelings. She denies thoughts of hurting herself or others. She denies paranoid delusions, but per Nursing report she was delusional and paranoid earlier on today. She denies auditory or visual hallucinations. Her cognition is difficult to assess. She retains three out of three and recalls two out of three objects after five minutes. Her insight and judgment are questionable.   SUICIDE RISK ASSESSMENT: This is a patient with no past  psychiatric history who has been deteriorating for several weeks for unclear reasons. She has not been able to manage her medications as before. She was found to have a urinalysis suggestive of urinary tract infection. It is possible that she has been misusing her medicines, but she is not actively suicidal.   ASSESSMENT:  AXIS I:  1. Encephalopathy secondary to medication/medical condition. 2. Rule out benzodiazepine withdrawal delirium.   AXIS II: Deferred.   AXIS III:  1. Dyslipidemia.  2. Hypothyroidism.  3. Gastroesophageal reflux disease.  4. Asthma.   AXIS IV: Loss of way of life, primary support.   AXIS V: Global Assessment of Functioning 40.   PLAN:  1. We will start low-dose Haldol for  symptoms of delirium.  2. We cannot rule out inappropriate use of benzodiazepines and benzodiazepine withdrawal delirium. I will start a very mild Ativan taper 0.5 mg for three doses and 0.25 mg for three doses.  3. I am out of town this weekend. Please call the  psychiatrist on call if necessary.  ____________________________ Ellin GoodieJolanta B. Jennet MaduroPucilowska, MD jbp:cbb D: 12/01/2011 15:20:55 ET  T: 12/01/2011 16:48:25 ET JOB#: [id number]

## 2014-11-15 NOTE — Consult Note (Signed)
Brief Consult Note: Diagnosis: Delirium secondary to medical condition/medications.   Patient was seen by consultant.   Consult note dictated.   Recommend further assessment or treatment.   Orders entered.   Discussed with Attending MD.   Comments: Ms. Rayfield CitizenHarzula has no h/o mental illness. For the past 3 weeks she has had "strange" thoughts but is unable to describe them to me.She denies thoughts of hurting herself or others. Per nursing report, she has been paranoid believing that she is on a "secret mission" in the hospital. She admits that she has not been taking medications like she should. For example, she has not been able to take her antibiotic prescribed by Dr. Graciela HusbandsKlein three times a day with food. She admits that she has pain medications and xanax at home but does not believe she has been taking it at all. [name] positive for opioids, benzos and tricyclics on UDS.   PLAN: 1. Will start low dose Haldol for delirium.   2. VS are stable. She does not seem to be withdrawing from benzos but I will start a brief taper.  3. I am out of town this weekend. Please call a psychiatrist on call if necessary.  Electronic Signatures: Kristine LineaPucilowska, Octavis Sheeler (MD)  (Signed 10-May-13 14:55)  Authored: Brief Consult Note   Last Updated: 10-May-13 14:55 by Kristine LineaPucilowska, Dayle Mcnerney (MD)

## 2014-11-15 NOTE — Consult Note (Signed)
Brief Consult Note: Diagnosis: Coffee-grund emesis.  No subjective complaint by patient for at least the past two months.  No documentation noted H&P.  History of Upper GI bleed in the past in review of Dr. Shelda AltesKapur's EGD reports but at this time H/H are stable.   Discussed with Attending MD.   Comments: Patient's presentation was discussed with Dr. Lutricia FeilPaul Oh.  Do not recommend proceeding with endoscopic evaluation at this time.  Recommend continue PPI therapy, anti-emetic therapy and monitoring of H/H serially.  If evidence of acute GI bleed presents then endoscopic evaluation will be warranted.  Electronic Signatures: Rodman KeyHarrison, Antrell Tipler S (NP)  (Signed [phone]-May-13 18:59)  Authored: Brief Consult Note   Last Updated: 24-May-13 18:59 by Rodman KeyHarrison, Chrishun Scheer S (NP)

## 2014-11-15 NOTE — Consult Note (Signed)
PATIENT NAME:  [name], [name] MR#:  [id number] DATE OF BIRTH:  [birth year]  DATE OF CONSULTATION:  12/01/2011  REFERRING PHYSICIAN:   Lynnea Ferrier, MD  CONSULTING PHYSICIAN: Jolanta B. Jennet Maduro, M.D.   REASON FOR CONSULTATION: To evaluate a patient with encephalopathy.   IDENTIFYING DATA: [name] is a 80 year old female with no past psychiatric history.   CHIEF COMPLAINT: "Something is wrong with my head."   HISTORY OF PRESENT ILLNESS: The patient reports that for the past three weeks or so she has been feeling differently. She has strange "thoughts" that she is unable to describe more precisely. [name] not paranoid. She does not have suicidal or homicidal thoughts. There are no auditory or visual hallucinations, yet she thinks that her thinking has been somewhat confused. She remembers that it happened at the time when Dr. Graciela Husbands prescribed an antibiotic to take three times a day with food. She reports that she had enormous difficulties following his instructions and would be waking up at midnight to drink a glass of milk and take her medications. She has not been consistent taking medicines lately. She has problems remembering. For example, there is one medication that she needs to take half an hour before food, and she says that an hour after her meal she would be still sitting with the pill taken. She has been prescribed pain medication and Xanax but told me that she has not been taking any of it. This is in contrast to her urine toxicology screen that is positive for benzodiazepines, opiates, and tricyclics. On admission to the hospital, the patient was utterly confused and unable to provide any information. Per Nursing staff, she has been behaving strangely, making statements suggestive of paranoia. For example, she believes at some point that she is in the hospital undercover sent by some agency to see how we do. When asked directly, she looked puzzled and was certain that she did not say  it to anybody; but apparently the thought was not new to her. She denies any symptoms of depression, anxiety, symptoms suggestive of bipolar mania or psychosis. She denies alcohol, illicit drugs, or prescription pill abuse.   PAST PSYCHIATRIC HISTORY: The patient denies.   FAMILY PSYCHIATRIC HISTORY: Her father had been very depressed and cut his wrist so severely that he was unable to use his hand ever since.   PAST MEDICAL HISTORY:  1. Hypertension.  2. Dyslipidemia.  3. Chronic asthma.  4. Recurrent sinusitis.  5. Chronic pain syndrome with multiple surgeries. 6. Chronic venostasis.  7. Recurrent fluid overload.  8. Osteoarthritis.  9. Nondisplaced right humeral fracture.  10. Gastroesophageal bleed.  11. Ductal carcinoma in situ.  12. Gastroesophageal reflux disease.  13. Osteoporosis. 14. Vitamin D B12 deficiency.   ALLERGIES: Augmentin, penicillin, codeine, doxycycline and Flagyl.   MEDICATIONS ON ADMISSION: Per Nursing staff, there are two separate lists of medications. I am not sure if they were reconciled. The one in the chart says: 1. Allegra 180 mg daily.  2. Baclofen 5 mg t.i.d. as needed.  3. Cipro 500 mg every 12 hours. 4. Flagyl 500 mg t.i.d. Flagyl is listed on her allergy list. 5. Guaifenesin 400 mg daily as needed.  6. Meperidine 50 mg, 1/2 tablet to 1 tablet every 6 hours as needed.  7. Metoclopramide 10 mg t.i.d.  8. Omeprazole 10 mg daily.  9. Promethazine 25 mg every 6 hours as needed.  10. Singulair 10 mg at bedtime.  11. Theophylline 400 mg, 1/2  pill b.i.d. 12. Tylenol Extra Strength 500 mg daily as needed.  13. Vicodin 5/500 every 4 to 6 hours as needed.  14. Zofran 4 mg every 4 hours as needed.   SOCIAL HISTORY: She lives by herself. Apparently she has never been married and has no children. She has a sister who was her mother's favorite whom she talks to. She has some neighbors that she was talking on the phone with. Also, church people were involved  in her admission. The patient states, however, that she is not close to anybody but that she has been doing all right. When asked if she plans to remain in her apartment indefinitely, she stated that she simply has no money to afford an assisted living facility. [name] a smoker.   REVIEW OF SYSTEMS: CONSTITUTIONAL: No fevers or chills. Positive for some weight loss. EYES: No double or blurred vision. ENT: No hearing loss. RESPIRATORY: Positive for occasional shortness of breath. CARDIOVASCULAR: No chest pain or orthopnea. GASTROINTESTINAL: No abdominal pain, vomiting, diarrhea. GENITOURINARY: No frequency or dysuria. ENDOCRINOLOGY: No heat or cold intolerance. SKIN: No acne or rashes. MUSCULOSKELETAL: Positive for chronic arthritic pain. NEUROLOGIC: No numbness or tingling. PSYCHIATRIC: See history of present illness for details.   PHYSICAL EXAMINATION:  VITAL SIGNS: Blood pressure 172/74, pulse 108, respirations 18, temperature 98.1.   GENERAL: This is an elderly female with swollen lower extremities, sitting in the chair talking on the phone, in no acute distress. The rest of the physical examination is deferred to her primary attending.   LABORATORY, DIAGNOSTIC AND RADIOLOGICAL DATA:  Chemistries are within normal limits except for potassium of 3.3.  LFTs are within normal limits except for AST of 44.  Urine toxicology screen is positive for benzodiazepine, opiates and tricyclics.  CBC: White blood count 5.9, red blood cells 3.81, hemoglobin 11.5, hematocrit 34.5, platelets 210.  Urinalysis on admission: Leukocyte esterase 3+ with 176 white blood cells. This improved to 1+ leukocyte esterase and 4 white blood cells per field.  Urine culture: No growth.  EKG: Sinus tachycardia, inferior infarct sited before.   MENTAL STATUS EXAMINATION: The patient is alert. [name] oriented to person and place, at times looking at the board, not entirely to situation. She does not remember much from admission and  is not a good historian. [name] well groomed. [name] wearing a hospital gown. Her legs are visibly swollen. She sits in a hospital chair. She maintains good eye contact. When I entered the room initially she was on the phone and refused to put it down, asking me to wait. The nurses report that she has been rather bossy and wanting to do things her way. [name] pleasant, polite, and cooperative during the interview. [name] engaging and interested in our interaction. Her speech is not loud but slightly pressured and a little slurred maybe from dry mouth, at times difficult to understand. Her mood is fine with full affect. Thought processing is slow and confused at times. The patient is unable to answer many questions precisely and has difficulties describing her thoughts or feelings. She denies thoughts of hurting herself or others. She denies paranoid delusions, but per Nursing report she was delusional and paranoid earlier on today. She denies auditory or visual hallucinations. Her cognition is difficult to assess. She retains three out of three and recalls two out of three objects after five minutes. Her insight and judgment are questionable.   SUICIDE RISK ASSESSMENT: This is a patient with no past  psychiatric history who has been deteriorating for several weeks for unclear reasons. She has not been able to manage her medications as before. She was found to have a urinalysis suggestive of urinary tract infection. It is possible that she has been misusing her medicines, but she is not actively suicidal.   ASSESSMENT:  AXIS I:  1. Encephalopathy secondary to medication/medical condition. 2. Rule out benzodiazepine withdrawal delirium.   AXIS II: Deferred.   AXIS III:  1. Dyslipidemia.  2. Hypothyroidism.  3. Gastroesophageal reflux disease.  4. Asthma.   AXIS IV: Loss of way of life, primary support.   AXIS V: Global Assessment of Functioning 40.   PLAN:  1. We will start low-dose Haldol for  symptoms of delirium.  2. We cannot rule out inappropriate use of benzodiazepines and benzodiazepine withdrawal delirium. I will start a very mild Ativan taper 0.5 mg for three doses and 0.25 mg for three doses.  3. I am out of town this weekend. Please call the  psychiatrist on call if necessary.  ____________________________ Ellin Goodie Jennet Maduro, MD jbp:cbb D: 12/01/2011 15:20:55 ET  T: 12/01/2011 16:48:25 ET JOB#: [id number] Shari Prows MD ELECTRONICALLY SIGNED 12/05/2011 22:16

## 2014-11-15 NOTE — Consult Note (Signed)
Details:    - Psychiatry: Patient seen. Still with blunted affect and slow thinking but alert and oriented and without delirium. Tired and feeling anxious aount her future. De nies any SI or HI.  Although she is still not sure of her mood I think it is likely she is still depressed and this affects her strength and motivation. I am still ok with psych transfer and pt says she is too. [facility] unit still full last I checked. Contact [facility] intake nurse to arrange transfer. No change to medication. Will follow. Supportive and educational therapy done.   Electronic Signatures: Audery Amellapacs, John T (MD)  (Signed 29-May-13 12:45)  Authored: Details   Last Updated: 29-May-13 12:45 by Audery Amellapacs, John T (MD)

## 2014-11-15 NOTE — Consult Note (Signed)
PATIENT NAME:  Regina, [name] MR#:  [id number] DATE OF BIRTH:  [birth year]  DATE OF CONSULTATION:  12/02/2011  REFERRING PHYSICIAN:  Ramonita Lab, MD CONSULTING PHYSICIAN:  Regina Crotteau B. Gustavia Carie, MD  REASON FOR CONSULTATION:  Altered mental status.  HISTORY OF PRESENT ILLNESS:  [name] is a 80 year old Caucasian female with a past medical history of hypertension, vitamin D deficiency, hyperlipidemia, dysphagia, gastroesophageal reflux disease, and recent urinary tract infection and diverticulitis at the beginning of May who presented to the Emergency Department two days ago with complaints of confusion.  She tells me that she took a taxi to the Emergency Department.  She was not able to provide any other information other than the fact that she felt "swishy".  [name] not able to give me any examples regarding her confusion.  She does feel that it has been improving in the past few days.  She thinks she has been here for a week even though she has only been here for two days.  In the Emergency Department she is very confused and not able to provide much history.  She lives alone in an assisted living facility and therefore there are no other family or friends that were able to help with the situation.  She has been somewhat paranoid while in the hospital, stating that she was sent here to be undercover and is now "stuck here".  She no longer believes that this is true.  She was seen by psychiatry who felt that she might have been overusing some of her pain medicines and benzodiazepines.  She was then placed on an Ativan taper. She was also placed on Haldol for delirium.  Her urine tox screen was positive for benzodiazepines, tricyclics, antidepressant, and opiates.  At the beginning of May she was treated with Cipro for a urinary tract infection and Flagyl for diverticulitis.  Apparently she has taken these medications in the past without any adverse side effects.  She has not started or stopped any  medications recently.  [name] work-up shows resolution of her urinary tract infection.  There are no abnormalities noted on non-contrast head CT.  Basic labs have been unremarkable.  REVIEW OF SYSTEMS:   A complete 14-point review of systems other than what is mentioned in the history of present illness is negative.  PAST MEDICAL HISTORY: 1. Old nondisplaced right humeral fracture. 2. Osteoarthritis. 3. Chronic venous stasis and recurrent fluid overload. 4. Chronic asthma. 5. Chronic pain syndrome. 6. Hypertension. 7. Hyperlipidemia. 8. Hospitalization for recurrent GI bleeding. 9. 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 10. Gastroesophageal reflux disease. 11. Osteoporosis. 12. Vitamin D deficiency.  HOME MEDICATIONS:  Allegra, Baclofen, Cipro, Flagyl, guaifenesin, meperidine, Reglan, omeprazole, promethazine, Singulair, theophylline, Tylenol, Vicodin, and Zofran.  ALLERGIES:  Augmentin, codeine, doxycycline, Flagyl.  SOCIAL HISTORY:   No smoking or illicits.  She used to smoke in the past but only a small amount.  She currently resides in an assisted living facility.  She has a sister that lives in [place] that she does not have much contact with.  She does not have any children.  FAMILY HISTORY:  Noncontributory.  PHYSICAL EXAMINATION: VITAL SIGNS:  Temperature 97.9, pulse 101, respirations 20, blood pressure 110/64, pulse oximetry 95% on room air.  GENERAL:  Supine,  no apparent distress.  CARDIOVASCULAR:  Regular rate and rhythm.  RESPIRATORY:  Clear anteriorly.  ABDOMEN:  Soft.  SKIN:  No rashes or lesions.  MENTAL STATUS:  [name] alert  and oriented to person, place but not to month or year.  [name] able to tell me the president.   She follows commands appropriately.  [name] able to perform a simple change calculation.  CRANIAL NERVES:   Pupils equal, round, reactive to light.  Full visual fields.  Extraocular movements intact.  No facial  sensory deficit.  Facial expression symmetric.  Shoulder shrug 5/5 bilaterally.  Tongue midline.  MOTOR:  Normal tone and bulk.  Strength 5/5 in the bilateral upper extremities without pronator drift.  Strength is 4-/5 bilateral hip flexion, 4/5 bilateral knee extension and knee flexion, and 5/5 bilateral dorsiflexion and plantar flexion.  DEEP TENDON REFLEXES:  2+/4 bilateral upper extremities and 0/4 bilateral lower extremities.  Toes are downgoing.  SENSATION:  No deficit to light touch or temperature throughout.  COORDINATION:  No ataxia or dysmetria with bilateral finger to nose.  GAIT:  Not assessed.  LABORATORY DATA:  CBC, basic metabolic panel, ESR, TSH, B12, liver function tests, and ammonia level are all within normal limits.  RPR is negative.  Urinalysis is negative.  IMAGING:  Noncontrast head CT on 05/09 showed no acute intracranial process.   ASSESSMENT AND PLAN:  [name] is a 80 year old female with a past medical history significant for hypertension, hyperlipidemia, asthma, and chronic pain who brought herself to the Emergency Department for alteration in mental status.  Per the primary hospitalist she has improved somewhat but is still not back to her baseline.  No infectious etiology has been identified.  The current consensus is that she may have been overusing some of her medications that include Xanax, Vicodin, Baclofen, and meperidine. [name] currently on an Ativan taper and is also receiving Haldol.   I do not think that there is any indication for a lumbar puncture given the low suspicion for a CNS infection.  In addition, her mental status appeared to be improving somewhat.  I do think performing an MRI of the brain would be reasonable, though, to continue the work-up for altered mental status.  There is no history that would support seizures; therefore we will not perform an EEG at this time.  Lastly, there may be an element of chronic dementia.  However, this is  difficult to assess without anyone else providing history.  If further history is obtained by family and friends and other medical providers that this is a concern, then it would be reasonable for her to initiate Aricept 5 mg daily.  However, I will hold off on ordering it at this time.   RECOMMENDATIONS:  1. Agree with Haldol and Ativan taper by psychiatry. 2. Obtain MRI of the brain. 3. Consider Aricept 5 mg daily if more history is obtained that there may be an element of chronic dementia.  Thank you for this consultation.  We will continue to follow.    ____________________________ Micheline Maze Jordynne Mccown, MD cbs:bjt D: 12/02/2011 11:55:00 ET T: 12/03/2011 12:47:24 ET JOB#: [id number]  cc: Iven Earnhart B. Steele Berg, MD, <Dictator> Arelia Sneddon MD ELECTRONICALLY SIGNED 12/24/2011 13:19

## 2014-11-15 NOTE — Consult Note (Signed)
Details:    - Psychiatry: PAtient seen today for follow up. Patient reports she is feeling some better. Not feeling extremely depressed. Does feel anxious and wories about placement as she had previously been independent. Affect is blunted. Denies current SI but also unsure unable to feel positive about the future. Physically patient continues to be unable to ambulate independently and needs significant care. Still not clear if patient has a depression or not although it seems likely. Not able to return to independent living.  If she is medically no longer needing inpatient we will take her back to psychiatry. Current ward was full last I checked. Try calling [facility] intake nurse for current status at [phone] for bed availibility before discharge.  Redundant consult ordered. I spoke with Dr Jeanie SewerWilliford. That can be cancelled.   Electronic Signatures: Audery Amellapacs, John T (MD)  (Signed [id number] [id number]-May-13 15:24)  Authored: Details   Last Updated: 28-May-13 15:24 by Audery Amellapacs, John T (MD)

## 2014-11-15 NOTE — Consult Note (Signed)
Details:    - Psychiatry: Patient seen. [name] alert and more interactive. Smiling more. Reports she is feeling better. Denies feeling very depressed. Affect a little brighter. Denies any SI or HI. Still not hungry and not eating well.   Will continue off sedating medications and on only the escitalopram for anxiety and depression. Will add some ensure as she had downstarirs to keep calorie intake and hydration up. Will follow.   Electronic Signatures: Audery Amellapacs, John T (MD)  (Signed [id number]-May-13 17:38)  Authored: Details   Last Updated: 25-May-13 17:38 by Audery Amellapacs, John T (MD)

## 2014-11-15 NOTE — Consult Note (Signed)
Details:    - Psychiatry: Patient seen. [name] about the same as the last few days. Does not endorse feeling depressed or negative but also showing little motivation or energy. Affect flattish. Alert and oriented but doesn't speak much spontaneously. Support done. No change in medication yet. [facility] At this point it may be better to discharge her to assisted living if she is otherwise medically stable as her depression can be managed outpatient.   Electronic Signatures: Audery Amellapacs, Andy Allende T (MD)  (Signed [id number]-May-13 14:04)  Authored: Details   Last Updated: 30-May-13 14:04 by Audery Amellapacs, Rober Skeels T (MD)

## 2014-11-15 NOTE — H&P (Signed)
PATIENT NAME:  [name], Abrielle E MR#:  [id number] DATE OF BIRTH:  [birth year]  DATE OF ADMISSION:  12/15/2011  PRIMARY CARE PHYSICIAN: Daniel NonesBert Klein, MD  REFERRING PHYSICIAN: Malachy Moanevainder Goli, MD  CHIEF COMPLAINT: Syncope today.   HISTORY OF PRESENT ILLNESS: This is a 80 year old Caucasian female with a history of hypertension, asthma, vitamin D3 deficiency, hyperlipidemia, dysphagia, GERD, and diverticulitis who was sent to the ED from the psych unit due to syncope today. According to psych unit sitter, the patient was admitted for suicidal ideation. She passed out and fell to the floor from the commode while she was using the bathroom. She lost consciousness, about seconds, and injured her forehead, but has no other injuries. The patient complains of dizziness before syncope. In addition, the patient complains of being thirsty, dysuria, and urinary frequency but she denies any fever or chills. No headache before syncope She denies any seizures or incontinence, no slurred speech.   PAST MEDICAL HISTORY:  1. Right humeral fracture. 2. Osteoarthritis. 3. Hypertension. 4. Asthma. 5. Chronic pain syndrome. 6. Hyperlipidemia. 7. Recurrent sinusitis. 8. Ventral hernia. 9. Gastrointestinal bleeding. 10. Ductal carcinoma in situ detected in left breast in 2000 status post radiation in 2007. 11. Gastroesophageal reflux disease.  12. Osteoporosis. 13. Vitamin D deficiency.     PAST SURGICAL HISTORY:  1. Status post bilateral knee surgery. 2. Status post Nissen fundoplication.  3. History of the neck surgery. 4. Status post bilateral cataract repair.  5. Right hip surgery. 6. Left foot reconstruction in 2008. 7. Foot surgery in 2012.  DRUG ALLERGIES: Augmentin, cocaine, doxycycline, Flagyl, penicillin, tape.  MEDICATIONS:  1. Advair Diskus 100 mcg/50 mcg one puff twice a day. 2. Allegra 24 hour tablets 180 mg p.o. once daily. 3. Aspirin 81 mg p.o. daily.  4. Baclofen 5 mg three times daily  p.r.n. for pain. 5. Cardizem 30 mg p.o. three times daily. 6. Depakote 250 mg p.o. three times daily before meals. 7. Metoclopramide 10 mg p.o. three times daily before meals. 8. Omeprazole 20 mg p.o. daily.  9. Risperdal 3 mg p.o. twice a day. 10. Singulair 10 mg p.o. in the evening.  11. Theophylline 100 mg p.o. twice a day.  REVIEW OF SYSTEMS: CONSTITUTIONAL: The patient denies any fever or chills but has a headache and dizziness and weakness. ENT: No double vision or blurred vision. No postnasal drainage or epistaxis. CARDIOVASCULAR: No chest pain, palpitations, orthopnea, or nocturnal dyspnea. PULMONARY: No cough, sputum, shortness of breath, or hematemesis. GASTROINTESTINAL: No abdominal pain, vomiting, diarrhea, melena or bloody stool, but has nausea. GENITOURINARY: Positive for dysuria and urinary frequency, but no hematuria. ENDOCRINE: Positive for polyuria and polydipsia. SKIN: No rash or jaundice. HEMATOLOGY: No easy bruising or bleeding. NEUROLOGY: Positive for syncope, but no seizure, incontinence, dysphagia, or slurred speech.   PHYSICAL EXAMINATION:   VITAL SIGNS: Temperature 97.8, blood pressure 130/86, and pulse 88. Oxygen saturation 92% in room air.  GENERAL: Alert, awake, and oriented, in no acute distress.   HEENT: Pupils are round, equal, and reactive to light and accommodation. Mild dry oral mucosa. Clear oropharynx.   NECK: Supple. No JVD or carotid bruits. No lymphadenopathy or thyromegaly.   PULMONARY: Bilateral air entry. No wheezing or rales.   CARDIOVASCULAR: S1 and S2 regular rate and rhythm. No murmurs or gallops.   PULMONARY: Bilateral air entry No wheezing or rales.   ABDOMEN: Soft. No distention. No tenderness. No organomegaly. Bowel sounds present.   EXTREMITIES: No edema, clubbing, or cyanosis. No calf  tenderness. The patient cannot move her legs, which is chronic.   SKIN: No rash or jaundice.   LABS/STUDIES: Glucose 173, BUN 33, creatinine 0.77,  sodium 142, potassium 4.9, chloride 101, and bicarbonate 31. WBC 16.0, hemoglobin 14.3, and platelets 210. Magnesium 2.8. Troponin less than 0.02. Valproic acid 42.   EKG shows normal sinus rhythm at 86 beats per minute, left axis deviation.   IMPRESSION:  1. Syncope, possibly due to dehydration.  2. Dehydration.  3. Leukocytosis.  4. Skull fracture with head injury. According to head CT report, in the superior calvarial vertex, there is cortical irregularity/discontinuity, probable small fracture with mild displacement. There is no hemorrhage present.  5. Leukocytosis, need to rule out urinary tract infection.  6. Hypertension, controlled.  7. Asthma.  8. Depression.  9. Suicidal ideation.   PLAN OF TREATMENT:  1. The patient will be placed for observation. We will continue telemetry monitoring, give IV fluid support, and follow-up BMP.  2. We will follow-up urinalysis and urine culture and blood culture.  3. For hypertension continue Cardizem.  4. For asthma continue Advair and Singulair.  5. For depression and suicidal ideation, continue one-to-one sitter and followup with psychiatry physician.   I discussed the patient's situation and the plan with the patient and the nurse.   TIME SPENT: About 60 minutes.  ____________________________ Shaune Pollack, MD qc:slb D: 12/15/2011 03:08:05 ET T: 12/15/2011 08:56:53 ET JOB#: [id number]  cc: Shaune Pollack, MD, <Dictator> Lynnea Ferrier, MD Shaune Pollack MD ELECTRONICALLY SIGNED 12/16/2011 12:29

## 2014-11-15 NOTE — H&P (Signed)
PATIENT NAME:  [name], [name] MR#:  [id number] DATE OF BIRTH:  09-12-1934  DATE OF ADMISSION:  12/15/2011  ADDENDUM  SOCIAL HISTORY: She does not drink, smoke, or use illicit drugs.   FAMILY HISTORY: Diabetes and coronary artery disease. ____________________________ Shaune PollackQing Clif Serio, MD qc:slb D: 12/15/2011 03:17:33 ET T: 12/15/2011 09:14:54 ET JOB#: [id number]  cc: Shaune PollackQing Rori Goar, MD, <Dictator> Shaune PollackQING Ilija Maxim MD ELECTRONICALLY SIGNED 12/16/2011 12:29

## 2014-11-15 NOTE — Consult Note (Signed)
Brief Consult Note: Diagnosis: Right shoulder arthritis.   Patient was seen by consultant.   Recommend further assessment or treatment.   Comments: 80 year old female complains of right shoulder pain on a chronic basis.  History is difficult due to dementia but has an old scar on the shoulder.   Exam:  Pain with range of motion of the shoulder.  Mild swelling.  No warmth.  circulation/sensation/motor function good distally.  complains of some elbow pain.  range of motion intact.    X-rays :  Right shoulder:  chronic changes consistent with old rotator cuff disease and arthritis.              Right elbow:  minimal arthritc changes.  No acute injury.  Imp:  Arthritis right shoulder and elbow.  Rx:  Ice and NSAIDs.  follow-up in office as needed.  Sling if needed.  Electronic Signatures: Valinda HoarMiller, Keylee Shrestha E (MD)  (Signed [phone]-May-13 18:06)  Authored: Brief Consult Note   Last Updated: 30-May-13 18:06 by Valinda HoarMiller, Jeweline Reif E (MD)

## 2014-11-15 NOTE — Consult Note (Signed)
Details:    - Psychiatry: Patient seen. Seems a little mopre confused. Does not really carry on a lucid conversation so much as make very brief responces that do not always make sense. [name] complaining of pain in her left wrist. Not clear what the eitiology is. Patient really not able to give much of a history. Affect flat. Denies SI. Seems ever more dementedbut it is worth increasing treatment for depression. Will increase lexapro to 20mg  a day and add back abilify at 5mg  a day for depression. Continue to follow.   Electronic Signatures: Audery Amellapacs, John T (MD)  (Signed 31-May-13 18:58)  Authored: Details   Last Updated: 31-May-13 18:58 by Audery Amellapacs, John T (MD)

## 2014-11-15 NOTE — Discharge Summary (Signed)
PATIENT NAMEKENNETHIA, [name] MR#:  [id number] DATE OF BIRTH:  [birth year]  DATE OF ADMISSION:  12/19/2011 DATE OF DISCHARGE:  12/27/2011  FINAL DIAGNOSES:  1. Syncope with fall, thought to be secondary to transient heart block.  2. Second degree heart block secondary to medications, including Cardizem and metoclopramide.  3. Concussion syndrome with possible skull fracture.  4. History of right humeral fracture.  5. Osteoarthritis with limited ambulation secondary to affective arthritis in her legs.  6. Hypertension.  7. Asthma/chronic obstructive pulmonary disease.  8. Chronic pain syndrome.  9. Hyperlipidemia.  10. History of gastrointestinal bleeding with ventral hernia and dyspepsia.  11. Ductal carcinoma in situ with previous radiation therapy.  12. Osteoporosis with vitamin D deficiency.  13. Status post bilateral knee surgeries.  14. History of Nissen fundoplication.  15. History of neck surgery.  16. Bilateral cataract repair.  17. History of right hip surgery.  18. History left foot surgery in 2008 and 2012.   ALLERGIES: Augmentin, doxycycline, Flagyl, penicillin, tape, and metoclopramide. Unclear whether patient allergic to Cardizem, she had tolerated this prior to the episode of heart block without issue.   HISTORY AND PHYSICAL: Please see dictated admission history and physical.   [place]: Patient was admitted from the Behavioral Medicine inpatient service to the medicine service after she had a syncopal episode which involved a fall with laceration on the top of her head requiring staples for repair, as well as multiple bruises. There is a question of skull fracture initially read out by radiology, mild. Repeat CT scan showed no changes. She had some concussion syndrome initially, which fortunately improved. Neurology saw the patient, and they were not sure that she actually had skull fracture, and recommended supportive care.   She was noted to be in  second degree AV block on arrival to the floor, she was taken off metoclopramide and Cardizem. She was evaluated by cardiology, and she returned to normal sinus rhythm with a first 24 hours, and remained in normal sinus rhythm over next several days with no evidence of heart block, and it was thought these medications were likely the source, although possibility was raised that head trauma could have caused it. It is my opinion that this is likely medication induced and there is literature of metoclopramide contributing to heart block, likely and combination of the Cardizem led to this unexpected finding.   Her depression continued to be an issue and psychiatry saw her during her hospitalization. Originally we had planned for to return to the inpatient psychiatric unit but they felt that she was stable enough that she did not need this.   She complained of significant pain in her shoulder. Films were performed which showed no evidence of fracture, and orthopedic consultation and MRI was performed, which reveals chronic changes, chronic rotator cuff tears, but no acute changes. Her pain level slowly improved and she was willing to move that arm.   Her breathing remained stable throughout her hospitalization on her home inhalers, and these were continued. She initially had some nausea and vomiting, question of coffee-ground emesis. This was not observed during her hospitalization here. GI saw the patient, and recommended conservative management. Suspect the nausea was really related to her concussion syndrome and this did not recur. She will be maintained on proton pump inhibitors.   She had been noted to have dementia previously and it was felt that this continued to be an issue. Psychiatry recommended placing her on Exelon patch,  neurology had previously seen her and had suggested this. This was tentatively performed, given her recent gastrointestinal symptoms, fortunately seemed to tolerate well and it will  be continued. Medications were adjusted for her depression as noted above.   Physical therapy attempted to work with patient on several occasions, and she was limited due to pain. It was clear she would not be able to return to assisted living, but at this point the plan is for her to go to skilled nursing facility for rehabilitation. Physical activity should be up with a walker with assistance as tolerated and she will need be on fall precautions. Physical therapy and occupational therapy will need to treat the patient after evaluation. She will begin on a no added salt diet. She will need a MET-B and CBC in one week with results to nursing home physician. She should follow up with psychiatry as scheduled.   She did have some anemia during this hospitalization, likely secondary to gastrointestinal bleed and due to chronic disease. Fortunately this remained stable throughout the hospitalization and follow up CBC is set up as noted above.   DISCHARGE MEDICATIONS:  1. Singulair 10 mg p.o. daily. 2. Advair 100/50, 1 puff b.i.d.  3. Pantoprazole 40 mg p.o. daily. 4. Albuterol metered-dose inhaler 2 puffs q.4 hours p.r.n. wheeze.  5. Norco 5/325 mg, 1 p.o. q.6 hours p.r.n. severe pain.  6. Celebrex 200 mg p.o. daily p.r.n. arthritis pain.  7. Abilify 5 mg p.o. daily.  8. Lexapro 20 mg p.o. daily.  9. MiraLax 17 grams p.o. daily p.r.n. constipation. 10. Colace 100 mg p.o. b.i.d., to be held for loose stools.  11. Exelon patch 4.6 mg topically daily.  12. Lisinopril 10 mg daily.   ____________________________ Adin Hector, MD bjk:cms D: 12/27/2011 13:08:33 ET T: 12/27/2011 13:47:08 ET JOB#: [id number]  cc: Tama High III, MD, <Dictator>  Ramonita Lab MD ELECTRONICALLY SIGNED 01/08/2012 12:26

## 2014-11-15 NOTE — Consult Note (Signed)
Chief Complaint:   Subjective/Chief Complaint No real GI complaints now. No nausea or vomiting. Eating food ok. Reg BM's.   VITAL SIGNS/ANCILLARY NOTES: **Vital Signs.:   26-May-13 08:35   Vital Signs Type Routine   Temperature Temperature (F) 98.1   Celsius 36.7   Temperature Source oral   Pulse Pulse 83   Pulse source per Dinamap   Respirations Respirations 24   Systolic BP Systolic BP 307   Diastolic BP (mmHg) Diastolic BP (mmHg) 74   Mean BP 94   BP Source Dinamap   Pulse Ox % Pulse Ox % 91   Pulse Ox Activity Level  At rest   Oxygen Delivery Room Air/ 21 %   Brief Assessment:   Cardiac Regular    Respiratory clear BS    Gastrointestinal mild RLQ tenderness   Routine Chem:  26-May-13 05:51    Glucose, Serum 75   BUN 25   Creatinine (comp) 0.47   Sodium, Serum 143   Potassium, Serum 3.9   Chloride, Serum 110   CO2, Serum 25   Calcium (Total), Serum 7.7   Osmolality (calc) 288   eGFR (African American) >60   eGFR (Non-African American) >60   Anion Gap 8  Routine Hem:  26-May-13 05:51    WBC (CBC) 13.9   RBC (CBC) 3.71   Hemoglobin (CBC) 11.2   Hematocrit (CBC) 34.3   Platelet Count (CBC) 145   MCV 93   MCH 30.2   MCHC 32.6   RDW 16.1  Routine Chem:  26-May-13 05:51    Magnesium, Serum 2.0  Routine Hem:  26-May-13 05:51    Bands 2   Segmented Neutrophils 79   Lymphocytes 7   Monocytes 11   Neutrophil % 73.9   Lymphocyte % 9.5   Monocyte % 15.6   Eosinophil % 0.4   Basophil % 0.6   Neutrophil # 10.3   Lymphocyte # 1.3   Monocyte # 2.2   Eosinophil # 0.1   Basophil # 0.1   Eosinophil 1   Assessment/Plan:  Assessment/Plan:   Assessment N/V resolved.    Plan Poss transfer back to psych tomorrow. Will sign off. Do recommend f/u with GI after discharge. Will need to arrange outpt colonoscopy since patient never had one. Thanks.   Electronic Signatures: Verdie Shire (MD)  (Signed [phone] 10:52)  Authored: Chief Complaint, VITAL  SIGNS/ANCILLARY NOTES, Brief Assessment, Lab Results, Assessment/Plan   Last Updated: 26-May-13 10:52 by Verdie Shire (MD)

## 2014-11-15 NOTE — Consult Note (Signed)
Chief Complaint:   Subjective/Chief Complaint Pt c/o vomiting but nurse aide only complains of dry heaves. No mention of any heartblocks today.   VITAL SIGNS/ANCILLARY NOTES: **Vital Signs.:   25-May-13 08:33   Vital Signs Type Routine   Temperature Temperature (F) 98.3   Celsius 36.8   Temperature Source oral   Pulse Pulse 96   Pulse source per Dinamap   Respirations Respirations 20   Systolic BP Systolic BP 972   Diastolic BP (mmHg) Diastolic BP (mmHg) 78   Mean BP 96   BP Source Dinamap   Pulse Ox % Pulse Ox % 92   Pulse Ox Activity Level  At rest   Oxygen Delivery Room Air/ 21 %   Brief Assessment:   Cardiac Regular    Respiratory clear BS    Gastrointestinal Normal  still c/o RLQ pain   Routine Chem:  25-May-13 06:31    Glucose, Serum 89   BUN 44   Creatinine (comp) 0.58   Sodium, Serum 142   Potassium, Serum 3.8   Chloride, Serum 105   CO2, Serum 27   Calcium (Total), Serum 7.9   Osmolality (calc) 294   eGFR (African American) >60   eGFR (Non-African American) >60   Anion Gap 10  Routine Hem:  25-May-13 06:31    WBC (CBC) 22.5   RBC (CBC) 4.55   Hemoglobin (CBC) 13.4   Hematocrit (CBC) 42.9   Platelet Count (CBC) 197   MCV 94   MCH 29.5   MCHC 31.2   RDW 16.3   Bands 13   Segmented Neutrophils 65   Lymphocytes 4   Monocytes 18   Assessment/Plan:  Assessment/Plan:   Assessment Nausea. RLQ abd pain.    Plan Continue protonix and nausea meds. Will eventually need colonoscopy which can be arranged later as outpt. Thanks.   Electronic Signatures: Verdie Shire (MD)  (Signed [phone] 10:17)  Authored: Chief Complaint, VITAL SIGNS/ANCILLARY NOTES, Brief Assessment, Lab Results, Assessment/Plan   Last Updated: 25-May-13 10:17 by Verdie Shire (MD)

## 2014-11-15 NOTE — Consult Note (Signed)
PATIENT NAME:  [name], [name] MR#:  [id number] DATE OF BIRTH:  03/11/1935  DATE OF CONSULTATION:  12/15/2011  REFERRING PHYSICIAN:  Dr. Imogene Burn  CONSULTING PHYSICIAN:  Lutricia Feil, MD/Adal Sereno Mort Sawyers, NP  PRIMARY CARE PHYSICIAN: Daniel Nones, MD    REASON FOR CONSULTATION:,: Coffee-ground emesis, abdominal pain.   HISTORY OF PRESENT ILLNESS: [name] is a 80 year old Caucasian female who has a significant past medical history of hypertension, asthma, Vitamin D deficiency, hyperlipidemia, dysphagia, gastroesophageal reflux disease, and diverticulitis. She was sent to the ER from the Psych Unit due to syncope today. According to the Psych Unit sitter, the patient was admitted for suicidal ideation. She fell on the floor from the commode while she was using the bathroom. She did lose consciousness for a few seconds injuring her head. The patient did complain of dizziness prior to the syncopal episode. The patient states that she started to experience right lower quadrant abdominal pain yesterday with associated nausea, vomited today but denies any coffee-ground emesis. She states that at times when she has vomited intermittently over the past it will be undigested food. No evidence of hematemesis such as bright red blood. No rectal bleeding. No melena. Denies being nauseated at this time. Unable to elaborate what her normal bowel pattern habit is. Last bowel movement was yesterday. The patient did have an upper endoscopy completed on 02/27/2005 by Dr. Maryruth Bun. Duodenum was normal. Dark clotted blood was found in the entire stomach. No active bleeding or bleeding site was visualized. Upper endoscopy was then done on 03/01/2005 for the indication of hematemesis, normal examined duodenum, gastric ulcers, normal esophagus. 03/10/2005 EGD was done as well again for melena by Dr. Maryruth Bun, normal examined duodenum, hematin in the gastric body was noted, and normal esophagus. 03/14/2005 EGD was again performed by Dr. Maryruth Bun.  Normal examined duodenum, retained food in the stomach. Normal esophagus. No record of colonoscopy in the past.   PAST MEDICAL HISTORY:  1. Right humeral fracture.  2. Osteoarthritis. 3. Hypertension. 4. Asthma. 5. Chronic pain syndrome. 6. Hyperlipidemia. 7. Recurrent sinusitis. 8. Ventral hernia. 9. Gastrointestinal bleeding. 10. Ductal carcinoma in-situ detected left breast status post radiation in 2007. 11. Reflux.  12. Osteoporosis.  13. Vitamin D deficiency.   PAST SURGICAL HISTORY:  1. Status post bilateral knee surgery. 2. Status post Nissen fundoplication. 3. History of neck surgery. 4. Bilateral cataract repair. 5. Right hip replacement, unknown date.  6. Left hip replacement, unknown date. 7. Left foot reconstruction in 2008 and foot surgery 2012.   ALLERGIES: Augmentin, codeine, doxycycline, Flagyl, penicillin, and tape.   HOME MEDICATIONS:  1. Advair Diskus 100 mcg/50 mcg 1 puff twice a day. 2. Allegra 24-hour tablet 180 mg once a day. 3. Aspirin 81 mg a day. 4. Baclofen 5 mg 3 times a day as needed. 5. Cardizem 300 mg 3 times a day. 6. Depakote 250 mg 3 times a day before meals. 7. Metoclopramide 10 mg 3 times a day before meals. 8. Omeprazole 20 mg daily. 9. Risperdal 3 mg twice daily.  10. Singulair 10 mg in the evening. 11. Theophylline 100 mg twice a day.   REVIEW OF SYSTEMS: Denies any fevers, chills. Significant for headache, dizziness, and weakness. ENT: No double vision, blurred vision. No epistaxis. CARDIOVASCULAR: No chest pain, heart palpitations. Significant for syncopal episode today. PULMONARY: No coughing. No wheezing, hemoptysis. GI: See history of present illness. GU: Significant for dysuria. No hematuria. ENDOCRINE: Significant for polyuria, polydipsia. SKIN: No rashes. No jaundice. HEMATOLOGIC:  Denies significant easy bruising and bleeding. NEUROLOGICAL: Significant for syncopal episode. PSYCH: Significant for depression as well as suicidal  ideations.   PHYSICAL EXAMINATION:   VITAL SIGNS: Temperature 98, pulse 101, respirations 20, blood pressure 117/86, pulse oximetry 91%.   GENERAL: Well developed, well nourished 80 year old Caucasian female, flat affect, depressive mood, very minimal eye contact. Appears to be resting comfortably in bed, sitter at bedside.   HEENT: Evidence of a traumatic fall. Large hematoma noted to right forehead. No evidence of active bleeding. Evidence of dry blood noted in hair. Pupils equal, reactive to light. Conjunctivae clear.   NECK: Supple. Trachea midline. No lymphadenopathy.   PULMONARY: Symmetric rise and fall of chest. Clear to auscultation throughout.   CARDIOVASCULAR: Regular rhythm, S1, S2.   ABDOMEN: Soft, nondistended. Bowel sounds hypoactive. Mild pain bilaterally to lower quadrants of abdomen.   RECTAL: Deferred.   MUSCULOSKELETAL: Movement of all four extremities. No contractures. No clubbing.   PSYCH: Flat affect. Depressive mood.   NEUROLOGIC: No gross neurological deficits.   LABORATORY, DIAGNOSTIC, AND RADIOLOGICAL DATA: In trending her hospital stay 12/13/2011 her glucose was only 29 by Accu-Chek, today 145. Chemistry panel glucose 173, BUN 33, otherwise within normal limits. Magnesium was elevated at 2.8 with a repeat at 3.0. Hepatic panel albumin 3.3. Troponin less than 0.02 on three occurrences. TSH 4.46. Valproic acid as low as 42. CBC WBC count elevated at 16.0 with RDW elevation 14.8, platelet count 210,000, repeat 204. Hemoglobin and hematocrit remained stable. Nitrites are positive on urinalysis with +3 leukocytes, RBC 7 per high-power field, WBC 42 per high-power field, trace bacteria as well as mucous being present.   EKG sinus tachycardia with fusion complexes.   CT of head without contrast status post fall in the superior calvarial vertex there is cortical discontinuity suggestive of a small fracture with slight displacement. No underlying hemorrhage is seen.  Atrophy with chronic small vessel ischemic disease.   Chest, PA and lateral no acute pulmonary disease.    IMPRESSION:  1. GI consult for coffee-ground emesis though no evidence noted in reviewing H and P record and the patient states no evidence of coffee-ground emesis recently. Occurrence was two months ago.  2. Recent fall having sustained head injury.  3. Significant psychiatric history.   PLAN: The patient's presentation was discussed with Dr. Lutricia FeilPaul Oh. Recommendation at this time is to monitor the patient given the fact that there is no documentation nor subjective complaint from patient of coffee-ground emesis. Recommend continuation of PPI therapy, antiemetic therapy, as well as laboratory serial monitoring of hemoglobin and hematocrit.   These services provided by Rodman Keyawn S. Fumie Fiallo, NP under collaborative agreement with Lutricia FeilPaul Oh, MD.  ____________________________ Rodman Keyawn S. Yonna Alwin, NP dsh:drc D: 12/15/2011 18:56:53 ET T: 12/16/2011 13:41:27 ET JOB#: [id number]  cc: Rodman Keyawn S. Sai Zinn, NP, <Dictator> Rodman KeyAWN S Demosthenes Virnig MD ELECTRONICALLY SIGNED 12/20/2011 15:29

## 2014-11-15 NOTE — Consult Note (Signed)
Pt seen and examined. Please see Dawn Harrison's notes. Supposedly coffee ground emesis though I do not have confirmation of this. Pt c/o RLQ abd pain. Multiple EGD's over the years, showing food in the stomach, tortuous esophagus, esophagitis, etc requiring dilations few times. Pt c/o RLQ pain. No records of colonoscopy available. Unfortunately, patient with skull fracture and evidence of 2nd degree and 3rd degree heart blocks. Cannot proceed with endoscopic evaluations at this time Continue PPI for now. Will follow patient. Thanks.  Electronic Signatures: Lutricia Feilh, Paytyn Mesta (MD)  (Signed on 24-May-13 19:44)  Authored  Last Updated: 24-May-13 19:44 by Lutricia Feilh, Tywanna Seifer (MD)

## 2014-11-15 NOTE — Consult Note (Signed)
Details:    - Psychiatry: Patient seen. Chart reviewed. Patient has bee followed by Dr Jennet MaduroPucilowska who is now away from work. I am covering. Patient appears calmer today than previously described. Still significant memory trouble and confusion that may be a dementia or the result of mental illness. I had agreesd with Dr Jennet MaduroPucilowska to admit pt to psychiatry once she is discharged from medicine. Spoke with Dr Graciela HusbandsKlein today as well. Please transport to psych once discharged from medicine. Psych intake nurse aware. Thanks.   Electronic Signatures: Audery Amellapacs, John T (MD)  (Signed 16-May-13 12:53)  Authored: Details   Last Updated: 16-May-13 12:53 by Audery Amellapacs, John T (MD)

## 2014-11-15 NOTE — Consult Note (Signed)
Brief Consult Note: Diagnosis: Mood disorder NOS, R/O bipolar affective disorder mania with psychosis, R/O Delirium secondary to medical condition/medications/ benzo/opioid withdrawal..   Patient was seen by consultant.   Consult note dictated.   Recommend further assessment or treatment.   Orders entered.   Discussed with Attending MD.   Comments: [name] has no h/o mental illness but her family reports strong history of prescription pill misuse most of her life. She came to the hosptal with AMS. She was disorganized, paranoid and delusional. She completed a brief benzodiazepine taper and a course of low dose haldol for presumed delirium with no improvement. She improved quite dramatically on a combination of Risperdal 3 mg twice daily and Depakote 250 mg three times daily. Her grandfather was reportedly an undiagnosed schizophrenic.   MSE: Alert oriented to person, place, time. Still not quite understanding her situation. [name] cool and collected. No mkore giddyness. She denies thoughts of hurting self or others. [name] still deusional and paranoid. There are no hallucinations. I/J are poor.   PLAN: 1. Will continue Risperdal to 3 mg twice daily for psychosis and continue depakote 250 mg tid for mood stabilization. Will check VPA level, LFTs and ammonia in am.   2. She would benefit from geropsychiatry admission.   3. I am out of town until 5/27. Dr. Toni Amendlapacs agreed to admit this patient to BMU when medically stable. llegues if they agree to admit this patient here.  Electronic Signatures: Kristine LineaPucilowska, Imanuel Pruiett (MD)  (Signed 15-May-13 16:58)  Authored: Brief Consult Note   Last Updated: 15-May-13 16:58 by Kristine LineaPucilowska, Jolin Benavides (MD)

## 2014-11-15 NOTE — Consult Note (Signed)
Details:    - Psychiatry: Patient seen. Says she is feeling better. Affect more energetic and feels energy is better. Still not eating well. Also, her memory was worse today not knowing where she was and not able to remember it at 3 minutes. 1. Dementia worse. Suggest adding Exelon patch low dose q24 for slowing dementia. 2. Continue current treatment of depression. At this point I do not think she would require or benifit from transfer to psychiatry ward. That is her preference, too. I suggest discharge to whatever level of assisted living currently applys to her. Will follow while in the hospital.   Electronic Signatures: Audery Amellapacs, Esteven Overfelt T (MD)  (Signed 03-Jun-13 10:58)  Authored: Details   Last Updated: 03-Jun-13 10:58 by Audery Amellapacs, Caylon Saine T (MD)

## 2014-11-15 NOTE — Consult Note (Signed)
Brief Consult Note: Diagnosis: Mood disorder NOS. R/O Delirium secondary to medical condition/medications.   Patient was seen by consultant.   Consult note dictated.   Recommend further assessment or treatment.   Orders entered.   Discussed with Attending MD.   Comments: [name] has no h/o mental illness. She came tro the hosptal with AMS. She completed a brief benzodiazepine taper. She did not respond to low dose haldol for presumed delirium. [name] no longer on opioids or benzos. She remains paranoid and delusional. Her family reports long history of medication misuse and mood lability. I spoke extensively with her nephew.  MSE: Alert oriented to person, place. She knows it is may and [name] is the president. [name] uncertain about her situation here. [name] giddy and slightly inapropriate. Her thinking is still disorganized. She denies thoughts of hurting self or others. She deusional and paranoid. There are no hallucinations. I/J are poor.   PLAN: 1. Will increase Risperdal to 3 mg twice daily for psychosis and continue depakote 250 mg tid for mood stabilization.  2. She would benefit from geropsychiatry admission. We will try to admit her here if beds available. [name] in agreement and wants to get better.   3. I will follow up.  Electronic Signatures: Kristine LineaPucilowska, Paw Karstens (MD)  (Signed 14-May-13 21:58)  Authored: Brief Consult Note   Last Updated: 14-May-13 21:58 by Kristine LineaPucilowska, [name] (MD)

## 2015-11-22 DEATH — deceased
